# Patient Record
Sex: Female | Born: 1990 | Race: White | Hispanic: Yes | Marital: Single | Smoking: Never smoker
Health system: Southern US, Community
[De-identification: ages and names within clinical notes are randomized; demographics above are authoritative.]

## PROBLEM LIST (undated history)

## (undated) ENCOUNTER — Emergency Department (HOSPITAL_COMMUNITY): Discharge status: Absent without leave | Payer: Self-pay

## (undated) ENCOUNTER — Emergency Department (HOSPITAL_COMMUNITY): Admission: EM | Payer: Self-pay | Source: Home / Self Care

## (undated) DIAGNOSIS — K8020 Calculus of gallbladder without cholecystitis without obstruction: Secondary | ICD-10-CM

## (undated) HISTORY — PX: CHOLECYSTECTOMY: SHX55

---
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
## 2009-02-19 ENCOUNTER — Emergency Department (HOSPITAL_COMMUNITY): Admission: EM | Admit: 2009-02-19 | Discharge: 2009-02-20 | Payer: Self-pay | Admitting: Emergency Medicine

## 2009-12-11 ENCOUNTER — Emergency Department (HOSPITAL_COMMUNITY): Admission: EM | Admit: 2009-12-11 | Discharge: 2009-12-11 | Payer: Self-pay | Admitting: Emergency Medicine

## 2010-05-02 LAB — URINE MICROSCOPIC-ADD ON

## 2010-05-02 LAB — URINALYSIS, ROUTINE W REFLEX MICROSCOPIC
Nitrite: NEGATIVE
Specific Gravity, Urine: 1.033 — ABNORMAL HIGH (ref 1.005–1.030)
Urobilinogen, UA: 0.2 mg/dL (ref 0.0–1.0)
pH: 5.5 (ref 5.0–8.0)

## 2010-05-02 LAB — WET PREP, GENITAL
Clue Cells Wet Prep HPF POC: NONE SEEN
Trich, Wet Prep: NONE SEEN

## 2010-05-02 LAB — URINE CULTURE

## 2010-05-02 LAB — POCT PREGNANCY, URINE: Preg Test, Ur: NEGATIVE

## 2010-05-02 LAB — GC/CHLAMYDIA PROBE AMP, GENITAL: GC Probe Amp, Genital: NEGATIVE

## 2010-05-06 LAB — URINALYSIS, ROUTINE W REFLEX MICROSCOPIC
Glucose, UA: NEGATIVE mg/dL
Ketones, ur: 15 mg/dL — AB
Specific Gravity, Urine: 1.03 (ref 1.005–1.030)
pH: 6 (ref 5.0–8.0)

## 2010-05-06 LAB — URINE MICROSCOPIC-ADD ON

## 2010-05-06 LAB — POCT PREGNANCY, URINE: Preg Test, Ur: NEGATIVE

## 2010-05-24 LAB — URINALYSIS, ROUTINE W REFLEX MICROSCOPIC
Bilirubin Urine: NEGATIVE
Protein, ur: NEGATIVE mg/dL
Specific Gravity, Urine: 1.017 (ref 1.005–1.030)
Urobilinogen, UA: 0.2 mg/dL (ref 0.0–1.0)

## 2010-05-24 LAB — COMPREHENSIVE METABOLIC PANEL
ALT: 30 U/L (ref 0–35)
BUN: 12 mg/dL (ref 6–23)
CO2: 23 mEq/L (ref 19–32)
Calcium: 9 mg/dL (ref 8.4–10.5)
Creatinine, Ser: 0.5 mg/dL (ref 0.4–1.2)
GFR calc Af Amer: >60 mL/min (ref >60)
GFR calc non Af Amer: >60 mL/min (ref >60)
Glucose, Bld: 89 mg/dL (ref 70–99)
Sodium: 137 mEq/L (ref 135–145)

## 2010-05-24 LAB — WET PREP, GENITAL
Clue Cells Wet Prep HPF POC: NONE SEEN
Trich, Wet Prep: NONE SEEN

## 2010-05-24 LAB — CBC
Hemoglobin: 13.7 g/dL (ref 12.0–15.0)
RBC: 4.56 MIL/uL (ref 3.87–5.11)
WBC: 6.2 10*3/uL (ref 4.0–10.5)

## 2010-05-24 LAB — DIFFERENTIAL
Basophils Absolute: 0 10*3/uL (ref 0.0–0.1)
Eosinophils Relative: 2 % (ref 0–5)
Lymphocytes Relative: 35 % (ref 12–46)

## 2010-05-24 LAB — POCT PREGNANCY, URINE: Preg Test, Ur: NEGATIVE

## 2010-05-27 LAB — CBC
HCT: 40.4 % (ref 36.0–46.0)
Hemoglobin: 14.2 g/dL (ref 12.0–15.0)
MCV: 87.6 fL (ref 78.0–100.0)
Platelets: 162 10*3/uL (ref 150–400)
RBC: 4.61 MIL/uL (ref 3.87–5.11)
WBC: 5.4 10*3/uL (ref 4.0–10.5)

## 2010-05-27 LAB — COMPREHENSIVE METABOLIC PANEL
BUN: 15 mg/dL (ref 6–23)
CO2: 25 mEq/L (ref 19–32)
Chloride: 105 mEq/L (ref 96–112)
Creatinine, Ser: 0.49 mg/dL (ref 0.4–1.2)
GFR calc non Af Amer: >60 mL/min (ref >60)
Glucose, Bld: 92 mg/dL (ref 70–99)
Total Bilirubin: 0.4 mg/dL (ref 0.3–1.2)

## 2010-05-27 LAB — WET PREP, GENITAL
Clue Cells Wet Prep HPF POC: NONE SEEN
Trich, Wet Prep: NONE SEEN
Yeast Wet Prep HPF POC: NONE SEEN

## 2010-05-27 LAB — URINALYSIS, ROUTINE W REFLEX MICROSCOPIC
Nitrite: NEGATIVE
Protein, ur: NEGATIVE mg/dL
Specific Gravity, Urine: 1.025 (ref 1.005–1.030)
Urobilinogen, UA: 0.2 mg/dL (ref 0.0–1.0)

## 2010-05-27 LAB — GC/CHLAMYDIA PROBE AMP, GENITAL: GC Probe Amp, Genital: NEGATIVE

## 2010-05-27 LAB — URINE MICROSCOPIC-ADD ON

## 2010-11-16 LAB — URINALYSIS, ROUTINE W REFLEX MICROSCOPIC
Bilirubin Urine: NEGATIVE
Glucose, UA: NEGATIVE
Ketones, ur: NEGATIVE
Specific Gravity, Urine: 1.02
pH: 6

## 2010-11-16 LAB — WET PREP, GENITAL: Yeast Wet Prep HPF POC: NONE SEEN

## 2010-11-16 LAB — GC/CHLAMYDIA PROBE AMP, GENITAL
Chlamydia, DNA Probe: NEGATIVE
GC Probe Amp, Genital: NEGATIVE

## 2010-11-27 LAB — HEPATIC FUNCTION PANEL
Albumin: 2 — ABNORMAL LOW
Alkaline Phosphatase: 149 — ABNORMAL HIGH
Indirect Bilirubin: 0.6
Total Protein: 5.3 — ABNORMAL LOW

## 2010-11-27 LAB — CBC
HCT: 37.4
Hemoglobin: 13.1
MCHC: 35
MCHC: 35
MCV: 86.5
Platelets: 127 — ABNORMAL LOW
Platelets: 141 — ABNORMAL LOW
RBC: 3.32 — ABNORMAL LOW
RDW: 13.8
RDW: 14.2

## 2011-04-13 ENCOUNTER — Emergency Department (HOSPITAL_COMMUNITY)
Admission: EM | Admit: 2011-04-13 | Discharge: 2011-04-13 | Discharge status: Against Medical Advice | Payer: Self-pay | Attending: Emergency Medicine | Admitting: Emergency Medicine

## 2011-04-13 ENCOUNTER — Encounter (HOSPITAL_COMMUNITY): Payer: Self-pay

## 2011-04-13 DIAGNOSIS — M25519 Pain in unspecified shoulder: Secondary | ICD-10-CM | POA: Insufficient documentation

## 2011-04-13 DIAGNOSIS — M542 Cervicalgia: Secondary | ICD-10-CM | POA: Insufficient documentation

## 2011-04-13 DIAGNOSIS — M79609 Pain in unspecified limb: Secondary | ICD-10-CM | POA: Insufficient documentation

## 2011-04-13 HISTORY — DX: Calculus of gallbladder without cholecystitis without obstruction: K80.20
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
## 2015-05-11 ENCOUNTER — Emergency Department (INDEPENDENT_AMBULATORY_CARE_PROVIDER_SITE_OTHER): Payer: Worker's Compensation

## 2015-05-11 ENCOUNTER — Emergency Department (INDEPENDENT_AMBULATORY_CARE_PROVIDER_SITE_OTHER)
Admission: EM | Admit: 2015-05-11 | Discharge: 2015-05-11 | Disposition: A | Discharge status: Home / Self Care | Payer: Worker's Compensation | Source: Home / Self Care | Attending: Family Medicine | Admitting: Family Medicine

## 2015-05-11 ENCOUNTER — Encounter (HOSPITAL_COMMUNITY): Payer: Self-pay | Admitting: Emergency Medicine

## 2015-05-11 DIAGNOSIS — S3992XA Unspecified injury of lower back, initial encounter: Secondary | ICD-10-CM

## 2015-05-11 DIAGNOSIS — N926 Irregular menstruation, unspecified: Secondary | ICD-10-CM

## 2015-05-11 LAB — POCT PREGNANCY, URINE: Preg Test, Ur: NEGATIVE

## 2015-05-11 MED ORDER — KETOROLAC TROMETHAMINE 60 MG/2ML IM SOLN
INTRAMUSCULAR | Status: AC
  Filled 2015-05-11: qty 2

## 2015-05-11 MED ORDER — NAPROXEN 500 MG PO TABS: 500.0000 mg | ORAL_TABLET | Freq: Two times a day (BID) | ORAL | Status: DC

## 2015-05-11 MED ORDER — KETOROLAC TROMETHAMINE 60 MG/2ML IM SOLN
60.0000 mg | Freq: Once | INTRAMUSCULAR | Status: AC
  Administered 2015-05-11: 60 mg via INTRAMUSCULAR

## 2015-05-11 MED ORDER — CYCLOBENZAPRINE HCL 10 MG PO TABS: 10.0000 mg | ORAL_TABLET | Freq: Every day | ORAL | Status: DC

## 2015-05-11 NOTE — Discharge Instructions (Signed)
Fue un Research officer, trade unionplacer verle hoy.    La prueba de embarazo salio' negativa.   Las placas del cuello, de la espalda y del codo izquierdo no demuestran ningun tipo de Surveyor, mineralsfractura de Shaw[place]os huesos.  Recibio' una inyeccion de una medicina anti-inflamatoria que se llama TORADOL 60mg .   Le estoy recetando otro anti-inflamatorio, NAPROXEN 500mg , para comenzar a tomar a Producer, television/film/videopartir de manana.  Debe tomar 1 tableta cada 12 horas, con algo de comer.   Relajante de musculos FLEXERIL 10mg  , tome una tableta por boca, antes de acostarse en la noche.  Le puede causar sueno.   Nota para el trabajo para [place].  Recomiendo que vaya manana a las 800am al OCCUPATIONAL MEDICINE en [place] (al lado del hospital).

## 2015-05-11 NOTE — ED Notes (Signed)
Patient c/o fall this morning aroung 5 am at work. Patient reports since then she has had pain. Patient is ambulating slowly. Patient speaking in spanish and son is translating so history is limited.

## 2015-05-11 NOTE — ED Provider Notes (Addendum)
CSN: [id number]     Arrival date & time 05/11/15  1830 History   First MD Initiated Contact with Patient 05/11/15 2041     Chief Complaint  Patient presents with  . Back Injury  . Fall   (Consider location/radiation/quality/duration/timing/severity/associated sxs/prior Treatment) Patient is a 25 y.o. female presenting with fall. The history is provided by the patient. No language interpreter was used.  Fall  Visit conducted in [place].    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 Slipped on floor and fell backward directly onto her back. She hit the back of her head to the floor, as well as her lower back and L elbow.  Has taken Tylenol for pain today.  Has had difficulty ambulating due to the pain today.   PMHx; no medication allergies.  No history of kidney or liver disease.   Social Hx: works in Marine scientistfactory, involves physical labor.   ROS: LMP first week February; she notes she is late for her menses. No loss of control of bowel or bladder.  No saddle anesthesia.   Past Medical History  Diagnosis Date  . Gallstones    Past Surgical History  Procedure Laterality Date  . Cholecystectomy     No family history on file. Social History  Substance Use Topics  . Smoking status: Never Smoker   . Smokeless tobacco: None  . Alcohol Use: None   OB History    No data available     Review of Systems  Constitutional: Negative for fever, chills, diaphoresis and fatigue.    Allergies  Review of patient's allergies indicates no known allergies.  Home Medications   Prior to Admission medications   Medication Sig Start Date End Date Taking? Authorizing Provider  HYDROcodone-acetaminophen (NORCO) 10-325 MG per tablet Take 1 tablet by mouth every 6 (six) hours as needed.    Historical Provider, MD   Meds Ordered and Administered this Visit  Medications - No data to display  BP 114/79 mmHg  Pulse  90  Temp(Src) 97.9 F (36.6 C) (Oral)  Resp 16  SpO2 100% No data found.   Physical Exam  Constitutional: She appears well-developed and well-nourished. No distress.  Alert, generally well appearing. Moving slowly due to apparent pain. No acute distress.   HENT:  Head: Normocephalic.  Right Ear: External ear normal.  Left Ear: External ear normal.  Mouth/Throat: Oropharyngeal exudate present.  Eyes: Pupils are equal, round, and reactive to light.  Neck: Neck supple.  Limited active ROM with rotation bilaterally, as well as with flexion/extension.  No point tenderness over vertebral processes in C-T-or LS-spine.   Musculoskeletal:  Limited active ROM neck with flexion/extension and rotation L and R.  No point tenderness over vertebral processes in spine (c to lumbosacral spine).   There is tenderness along paraspinous mm in mid-thoracic and lumbar spine.   Able to flex hips full strength symmetrically.   Sensation in feet full and symmetric. Palpable dp pulses bilaterally, no edema. Full strength ankles dorsi/plantarflexion bilaterally.  Knees full flexion and extension bilaterally.   Tenderness over L olecranon; handgrip full and symmetric bilaterally. Sensation in hands full and symmetric. Full ROM wrists, no tenderness bilaterally.   Lymphadenopathy:    She has no cervical adenopathy.  Skin: She is not diaphoretic.    ED Course  Procedures (including critical care time)  Labs Review Labs Reviewed - No data to display  Imaging Review No results found.   Visual Acuity Review  Right Eye Distance:   Left Eye Distance:   Bilateral Distance:    Right Eye Near:   Left Eye Near:    Bilateral Near:         MDM   1. Back injury, initial encounter   2. Missed period    Urine pregnancy test before x-rays.   Patient with L elbow and neck/low back pain after fall at work.  X rays L elbow, cervical spine and lumbar spine reviewed by me, no evidence of fracture or  dislocation.  Toradol IM at [facility] tonight; Naproxen  twice daily, and flexeril  at bedtime. Ice/heat to back, neck, elbow.    Note for work for tomorrow.   Occupational medicine in the morning.     Barbaraann Barthel, MD 05/11/15 0981  Barbaraann Barthel, MD 05/11/15 1914  Barbaraann Barthel, MD 05/11/15 [phone]

## 2015-05-19 ENCOUNTER — Other Ambulatory Visit: Payer: Self-pay | Admitting: Occupational Medicine

## 2015-05-19 ENCOUNTER — Ambulatory Visit: Payer: Self-pay

## 2015-05-19 DIAGNOSIS — M549 Dorsalgia, unspecified: Secondary | ICD-10-CM

## 2015-06-15 ENCOUNTER — Emergency Department (HOSPITAL_COMMUNITY)
Admission: EM | Admit: 2015-06-15 | Discharge: 2015-06-15 | Disposition: A | Discharge status: Home / Self Care | Payer: Self-pay | Attending: Emergency Medicine | Admitting: Emergency Medicine

## 2015-06-15 ENCOUNTER — Encounter (HOSPITAL_COMMUNITY): Payer: Self-pay

## 2015-06-15 DIAGNOSIS — Z791 Long term (current) use of non-steroidal anti-inflammatories (NSAID): Secondary | ICD-10-CM | POA: Insufficient documentation

## 2015-06-15 DIAGNOSIS — Z79899 Other long term (current) drug therapy: Secondary | ICD-10-CM | POA: Insufficient documentation

## 2015-06-15 DIAGNOSIS — Z8719 Personal history of other diseases of the digestive system: Secondary | ICD-10-CM | POA: Insufficient documentation

## 2015-06-15 DIAGNOSIS — J069 Acute upper respiratory infection, unspecified: Secondary | ICD-10-CM | POA: Insufficient documentation

## 2015-06-15 DIAGNOSIS — H109 Unspecified conjunctivitis: Secondary | ICD-10-CM | POA: Insufficient documentation

## 2015-06-15 MED ORDER — DEXAMETHASONE SODIUM PHOSPHATE 10 MG/ML IJ SOLN
10.0000 mg | Freq: Once | INTRAMUSCULAR | Status: AC
  Administered 2015-06-15: 10 mg via INTRAMUSCULAR
  Filled 2015-06-15: qty 1

## 2015-06-15 MED ORDER — LORATADINE-PSEUDOEPHEDRINE ER 5-120 MG PO TB12: 1.0000 | ORAL_TABLET | Freq: Two times a day (BID) | ORAL | Status: DC

## 2015-06-15 MED ORDER — ERYTHROMYCIN 5 MG/GM OP OINT
1.0000 "application " | TOPICAL_OINTMENT | Freq: Once | OPHTHALMIC | Status: AC
  Administered 2015-06-15: 1 via OPHTHALMIC
  Filled 2015-06-15: qty 3.5

## 2015-06-15 MED ORDER — BENZONATATE 100 MG PO CAPS: 100.0000 mg | ORAL_CAPSULE | Freq: Three times a day (TID) | ORAL | Status: DC

## 2015-06-15 MED ORDER — KETOROLAC TROMETHAMINE 60 MG/2ML IM SOLN
60.0000 mg | Freq: Once | INTRAMUSCULAR | Status: AC
  Administered 2015-06-15: 60 mg via INTRAMUSCULAR
  Filled 2015-06-15: qty 2

## 2015-06-15 NOTE — ED Provider Notes (Signed)
CSN: [id number]     Arrival date & time 06/15/15  [id number] History   First MD Initiated Contact with Patient 06/15/15 [phone]     Chief Complaint  Patient presents with  . URI  . Conjunctivitis     (Consider location/radiation/quality/duration/timing/severity/associated sxs/prior Treatment) Patient is a 25 y.o. female presenting with URI.  URI Presenting symptoms: congestion, cough, fever, rhinorrhea and sore throat   Severity:  Mild Onset quality:  Gradual Duration:  3 days Timing:  Constant Progression:  Worsening Chronicity:  New Relieved by:  None tried Worsened by:  Nothing tried Ineffective treatments:  None tried Associated symptoms: sinus pain   Associated symptoms: no arthralgias   Risk factors: no recent illness and no recent travel     Past Medical History  Diagnosis Date  . Gallstones    Past Surgical History  Procedure Laterality Date  . Cholecystectomy     History reviewed. No pertinent family history. Social History  Substance Use Topics  . Smoking status: Never Smoker   . Smokeless tobacco: None  . Alcohol Use: None   OB History    No data available     Review of Systems  Constitutional: Positive for fever.  HENT: Positive for congestion, rhinorrhea and sore throat.   Eyes: Negative for pain.  Respiratory: Positive for cough.   Musculoskeletal: Negative for arthralgias.  All other systems reviewed and are negative.     Allergies  Review of patient's allergies indicates no known allergies.  Home Medications   Prior to Admission medications   Medication Sig Start Date End Date Taking? Authorizing Provider  benzonatate (TESSALON) 100 MG capsule Take 1 capsule (100 mg total) by mouth every 8 (eight) hours. 06/15/15   Marily MemosJason Jameriah Trotti, MD  cyclobenzaprine (FLEXERIL) 10 MG tablet Take 1 tablet (10 mg total) by mouth at bedtime. 05/11/15   Barbaraann BarthelJames O Breen, MD  loratadine-pseudoephedrine (CLARITIN-D 12 HOUR) 5-120 MG tablet Take 1 tablet by mouth 2 (two)  times daily. 06/15/15   Marily MemosJason Jillayne Witte, MD  naproxen (NAPROSYN) 500 MG tablet Take 1 tablet (500 mg total) by mouth 2 (two) times daily with a meal. 05/11/15   Barbaraann BarthelJames O Breen, MD   BP 130/75 mmHg  Pulse 100  Temp(Src) 98.7 F (37.1 C) (Oral)  Resp 18  Ht 5\' 1"  (1.549 m)  Wt 138 lb (62.596 kg)  BMI 26.09 kg/m2  SpO2 98%  LMP 05/16/2015 Physical Exam  Constitutional: She is oriented to person, place, and time. She appears well-developed and well-nourished.  HENT:  Head: Normocephalic and atraumatic.  Nose: Mucosal edema present.  Mouth/Throat: Posterior oropharyngeal erythema present.  Neck: Normal range of motion.  Cardiovascular: Normal rate and regular rhythm.   Pulmonary/Chest: Effort normal. No stridor. No respiratory distress. She has no wheezes.  Abdominal: Soft. She exhibits no distension. There is no tenderness.  Musculoskeletal: Normal range of motion. She exhibits no edema or tenderness.  Neurological: She is alert and oriented to person, place, and time. No cranial nerve deficit. Coordination normal.  Skin: Skin is warm and dry.  Nursing note and vitals reviewed.   ED Course  Procedures (including critical care time) Labs Review Labs Reviewed - No data to display  Imaging Review No results found. I have personally reviewed and evaluated these images and lab results as part of my medical decision-making.   EKG Interpretation None      MDM   Final diagnoses:  URI (upper respiratory infection)  Conjunctivitis of left eye   Glenford PeersUri  with likely viral conjunctivitis. No asymmetric lung sounds to suggest pneumonia. S/s c/w likely influenza.  New Prescriptions: New Prescriptions   BENZONATATE (TESSALON) 100 MG CAPSULE    Take 1 capsule (100 mg total) by mouth every 8 (eight) hours.   LORATADINE-PSEUDOEPHEDRINE (CLARITIN-D 12 HOUR) 5-120 MG TABLET    Take 1 tablet by mouth 2 (two) times daily.     I have personally and contemperaneously reviewed labs and imaging and  used in my decision making as above.   A medical screening exam was performed and I feel the patient has had an appropriate workup for their chief complaint at this time and likelihood of emergent condition existing is low. Their vital signs are stable. They have been counseled on decision, discharge, follow up and which symptoms necessitate immediate return to the emergency department.  They verbally stated understanding and agreement with plan and discharged in stable condition.      Marily Memos, MD 06/15/15 [phone]

## 2015-06-15 NOTE — ED Notes (Signed)
Pt reports redness and itching to left eye along with cough x15 days.  Pt also reports nosebleeds.

## 2015-06-19 ENCOUNTER — Emergency Department (HOSPITAL_COMMUNITY): Payer: Self-pay

## 2015-06-19 ENCOUNTER — Encounter (HOSPITAL_COMMUNITY): Payer: Self-pay | Admitting: Emergency Medicine

## 2015-06-19 DIAGNOSIS — J069 Acute upper respiratory infection, unspecified: Secondary | ICD-10-CM | POA: Insufficient documentation

## 2015-06-19 DIAGNOSIS — J302 Other seasonal allergic rhinitis: Secondary | ICD-10-CM | POA: Insufficient documentation

## 2015-06-19 DIAGNOSIS — Z79899 Other long term (current) drug therapy: Secondary | ICD-10-CM | POA: Insufficient documentation

## 2015-06-19 DIAGNOSIS — Z791 Long term (current) use of non-steroidal anti-inflammatories (NSAID): Secondary | ICD-10-CM | POA: Insufficient documentation

## 2015-06-19 DIAGNOSIS — J209 Acute bronchitis, unspecified: Secondary | ICD-10-CM | POA: Insufficient documentation

## 2015-06-19 DIAGNOSIS — R079 Chest pain, unspecified: Secondary | ICD-10-CM | POA: Insufficient documentation

## 2015-06-19 LAB — CBC
HEMATOCRIT: 40.7 % (ref 36.0–46.0)
Hemoglobin: 14.1 g/dL (ref 12.0–15.0)
MCH: 29 pg (ref 26.0–34.0)
MCHC: 34.6 g/dL (ref 30.0–36.0)
MCV: 83.7 fL (ref 78.0–100.0)
PLATELETS: 219 10*3/uL (ref 150–400)
RBC: 4.86 MIL/uL (ref 3.87–5.11)
RDW: 12.5 % (ref 11.5–15.5)
WBC: 8 10*3/uL (ref 4.0–10.5)

## 2015-06-19 NOTE — ED Notes (Signed)
Pt states she has has coughing and watery eyes that she thought was related to all the pollen but for the last couple of days she has been nausea and coughed up blood twice. Pt states sometimes when coughing she has felt short of breath and having central chest pain.

## 2015-06-20 ENCOUNTER — Emergency Department (HOSPITAL_COMMUNITY)
Admission: EM | Admit: 2015-06-20 | Discharge: 2015-06-20 | Disposition: A | Discharge status: Home / Self Care | Payer: Self-pay | Attending: Emergency Medicine | Admitting: Emergency Medicine

## 2015-06-20 DIAGNOSIS — J069 Acute upper respiratory infection, unspecified: Secondary | ICD-10-CM

## 2015-06-20 DIAGNOSIS — J302 Other seasonal allergic rhinitis: Secondary | ICD-10-CM

## 2015-06-20 DIAGNOSIS — J40 Bronchitis, not specified as acute or chronic: Secondary | ICD-10-CM

## 2015-06-20 LAB — I-STAT TROPONIN, ED: Troponin i, poc: 0 ng/mL (ref 0.00–0.08)

## 2015-06-20 LAB — BASIC METABOLIC PANEL
ANION GAP: 10 (ref 5–15)
BUN: 17 mg/dL (ref 6–20)
CALCIUM: 9.3 mg/dL (ref 8.9–10.3)
CO2: 25 mmol/L (ref 22–32)
Chloride: 104 mmol/L (ref 101–111)
Creatinine, Ser: 0.78 mg/dL (ref 0.44–1.00)
GFR calc Af Amer: >60 mL/min (ref >60)
GFR calc non Af Amer: >60 mL/min (ref >60)
GLUCOSE: 101 mg/dL — AB (ref 65–99)
Potassium: 3.5 mmol/L (ref 3.5–5.1)
Sodium: 139 mmol/L (ref 135–145)

## 2015-06-20 MED ORDER — IPRATROPIUM BROMIDE 0.02 % IN SOLN
0.5000 mg | Freq: Once | RESPIRATORY_TRACT | Status: AC
  Administered 2015-06-20: 0.5 mg via RESPIRATORY_TRACT
  Filled 2015-06-20: qty 2.5

## 2015-06-20 MED ORDER — PREDNISONE 20 MG PO TABS
60.0000 mg | ORAL_TABLET | Freq: Once | ORAL | Status: AC
  Administered 2015-06-20: 60 mg via ORAL
  Filled 2015-06-20: qty 3

## 2015-06-20 MED ORDER — ALBUTEROL SULFATE (2.5 MG/3ML) 0.083% IN NEBU: 2.5000 mg | INHALATION_SOLUTION | Freq: Four times a day (QID) | RESPIRATORY_TRACT | Status: DC | PRN

## 2015-06-20 MED ORDER — CETIRIZINE HCL 10 MG PO TABS: 10.0000 mg | ORAL_TABLET | Freq: Every day | ORAL | Status: DC

## 2015-06-20 MED ORDER — AZITHROMYCIN 250 MG PO TABS: 250.0000 mg | ORAL_TABLET | Freq: Every day | ORAL | Status: DC

## 2015-06-20 MED ORDER — PREDNISONE 20 MG PO TABS: 40.0000 mg | ORAL_TABLET | Freq: Every day | ORAL | Status: DC

## 2015-06-20 MED ORDER — ALBUTEROL SULFATE (2.5 MG/3ML) 0.083% IN NEBU
5.0000 mg | INHALATION_SOLUTION | Freq: Once | RESPIRATORY_TRACT | Status: AC
Start: 2015-06-20 — End: 2015-06-20
  Administered 2015-06-20: 5 mg via RESPIRATORY_TRACT
  Filled 2015-06-20: qty 6

## 2015-06-20 MED ORDER — ALBUTEROL SULFATE HFA 108 (90 BASE) MCG/ACT IN AERS
2.0000 | INHALATION_SPRAY | Freq: Once | RESPIRATORY_TRACT | Status: AC
  Administered 2015-06-20: 2 via RESPIRATORY_TRACT
  Filled 2015-06-20: qty 6.7

## 2015-06-20 MED ORDER — BENZONATATE 100 MG PO CAPS
200.0000 mg | ORAL_CAPSULE | Freq: Once | ORAL | Status: AC
  Administered 2015-06-20: 200 mg via ORAL
  Filled 2015-06-20 (×2): qty 2

## 2015-06-20 MED ORDER — ALBUTEROL SULFATE (2.5 MG/3ML) 0.083% IN NEBU
5.0000 mg | INHALATION_SOLUTION | Freq: Once | RESPIRATORY_TRACT | Status: AC
  Administered 2015-06-20: 5 mg via RESPIRATORY_TRACT
  Filled 2015-06-20: qty 6

## 2015-06-20 MED ORDER — GUAIFENESIN-CODEINE 100-10 MG/5ML PO SOLN: 5.0000 mL | Freq: Three times a day (TID) | ORAL | Status: DC | PRN

## 2015-06-20 MED ORDER — ALBUTEROL SULFATE HFA 108 (90 BASE) MCG/ACT IN AERS: 1.0000 | INHALATION_SPRAY | Freq: Four times a day (QID) | RESPIRATORY_TRACT | Status: DC | PRN

## 2015-06-20 MED ORDER — BENZONATATE 100 MG PO CAPS: 200.0000 mg | ORAL_CAPSULE | Freq: Two times a day (BID) | ORAL | Status: DC | PRN

## 2015-06-20 MED ORDER — BENZONATATE 100 MG PO CAPS
200.0000 mg | ORAL_CAPSULE | Freq: Once | ORAL | Status: DC
  Filled 2015-06-20: qty 2

## 2015-06-20 MED ORDER — PREDNISONE 20 MG PO TABS
40.0000 mg | ORAL_TABLET | Freq: Once | ORAL | Status: AC
  Administered 2015-06-20: 40 mg via ORAL
  Filled 2015-06-20: qty 2

## 2015-06-20 MED ORDER — IPRATROPIUM-ALBUTEROL 0.5-2.5 (3) MG/3ML IN SOLN
3.0000 mL | Freq: Once | RESPIRATORY_TRACT | Status: AC
  Administered 2015-06-20: 3 mL via RESPIRATORY_TRACT
  Filled 2015-06-20: qty 3

## 2015-06-20 NOTE — Discharge Instructions (Signed)
Acute Bronchitis Bronchitis is inflammation of the airways that extend from the windpipe into the lungs (bronchi). The inflammation often causes mucus to develop. This leads to a cough, which is the most common symptom of bronchitis.  In acute bronchitis, the condition usually develops suddenly and goes away over time, usually in a couple weeks. Smoking, allergies, and asthma can make bronchitis worse. Repeated episodes of bronchitis may cause further lung problems.  CAUSES Acute bronchitis is most often caused by the same virus that causes a cold. The virus can spread from person to person (contagious) through coughing, sneezing, and touching contaminated objects. SIGNS AND SYMPTOMS   Cough.   Fever.   Coughing up mucus.   Body aches.   Chest congestion.   Chills.   Shortness of breath.   Sore throat.  DIAGNOSIS  Acute bronchitis is usually diagnosed through a physical exam. Your health care provider will also ask you questions about your medical history. Tests, such as chest X-rays, are sometimes done to rule out other conditions.  TREATMENT  Acute bronchitis usually goes away in a couple weeks. Oftentimes, no medical treatment is necessary. Medicines are sometimes given for relief of fever or cough. Antibiotic medicines are usually not needed but may be prescribed in certain situations. In some cases, an inhaler may be recommended to help reduce shortness of breath and control the cough. A cool mist vaporizer may also be used to help thin bronchial secretions and make it easier to clear the chest.  HOME CARE INSTRUCTIONS  Get plenty of rest.   Drink enough fluids to keep your urine clear or pale yellow (unless you have a medical condition that requires fluid restriction). Increasing fluids may help thin your respiratory secretions (sputum) and reduce chest congestion, and it will prevent dehydration.   Take medicines only as directed by your health care provider.  If  you were prescribed an antibiotic medicine, finish it all even if you start to feel better.  Avoid smoking and secondhand smoke. Exposure to cigarette smoke or irritating chemicals will make bronchitis worse. If you are a smoker, consider using nicotine gum or skin patches to help control withdrawal symptoms. Quitting smoking will help your lungs heal faster.   Reduce the chances of another bout of acute bronchitis by washing your hands frequently, avoiding people with cold symptoms, and trying not to touch your hands to your mouth, nose, or eyes.   Keep all follow-up visits as directed by your health care provider.  SEEK MEDICAL CARE IF: Your symptoms do not improve after 1 week of treatment.  SEEK IMMEDIATE MEDICAL CARE IF:  You develop an increased fever or chills.   You have chest pain.   You have severe shortness of breath.  You have bloody sputum.   You develop dehydration.  You faint or repeatedly feel like you are going to pass out.  You develop repeated vomiting.  You develop a severe headache. MAKE SURE YOU:   Understand these instructions.  Will watch your condition.  Will get help right away if you are not doing well or get worse.   This information is not intended to replace advice given to you by your health care provider. Make sure you discuss any questions you have with your health care provider.   Document Released: 03/14/2004 Document Revised: 02/25/2014 Document Reviewed: 07/28/2012 Elsevier Interactive Patient Education 2016 [place], broncoespasmo agudo (Asthma, Acute Bronchospasm) El broncoespasmo agudo causado por el asma tambin se conoce como crisis  de asma. Broncoespasmo significa que las vas respiratorias se han estrechado. La causa del estrechamiento es la inflamacin y la constriccin de los msculos de las vas respiratorias (bronquios) que se encuentran en los pulmones. Esto puede dificultar la respiracin o provocarle  sibilancias y tos. [place] desencadenantes posibles son:  La caspa que eliminan los animales de la piel, el pelo o las plumas de [place].  Los caros que se encuentran en el polvo de la casa.  [place].  El polen de los rboles o el csped.  [place]o.  El humo del cigarrillo o del tabaco  Sustancias contaminantes como el polvo, limpiadores hogareos, aerosoles (como los [place] para el cabello), vapores de pintura, sustancias qumicas fuertes u olores intensos.  El aire fro o cambios climticos. El aire fro puede causar inflamacin. El viento aumenta la cantidad de moho y polen del aire.  Emociones fuertes, Engineer, production o rer intensamente.  Estrs.  Ciertos medicamentos como la aspirina o betabloqueantes.  Los sulfitos que se encuentran en las comidas y bebidas como frutas secas y el vino.  Enfermedades infecciosas o inflamatorias, como la gripe, el resfro o la inflamacin de las membranas nasales (rinitis).  El reflujo gastroesofgico (ERGE). El reflujo gastroesofgico es una afeccin en la que los cidos estomacales vuelven al esfago.  Los ejercicios o actividades extenuantes. [place].  Tos intensa, especialmente por la noche.  Opresin en el pecho.  Falta de aire. DIAGNSTICO  El mdico le har una historia clnica y le har un examen fsico. [place] Comment indicarn radiografas o anlisis de sangre para buscar otras causas de los sntomas u otras enfermedades que puedan desencadenar una crisis de asma.  TRATAMIENTO  El tratamiento est dirigido a reducir la inflamacin y [place] vas respiratorias en los pulmones. La mayor parte de las crisis asmticas se tratan con medicamentos por va inhalatoria. Entre ellos se incluyen los medicamentos de alivio rpido o medicamentos de rescate (como los broncodilatadores) y los medicamentos de control (como los corticoides inhalados). Estos medicamentos se administran a travs de Educational psychologist o de un  nebulizador. Los corticoides sistmicos por va oral o por va intravenosa tambin se administran para reducir la inflamacin cuando un ataque es moderado o grave. Los antibiticos se indican solo si hay infeccin bacteriana.  INSTRUCCIONES PARA EL CUIDADO EN EL HOGAR   Reposo.  Beba lquido en abundancia. Esto ayuda a diluir la mucosidad y a Transport planner. Solo consuma productos con cafena moderadamente y no consuma alcohol hasta que se haya recuperado de la enfermedad.  No fume. Evite la exposicin al humo de otros fumadores.  Usted tiene un rol fundamental en mantener su buena salud. Evite la exposicin a lo que [facility] respiratorios.  Mantenga los medicamentos actualizados y al alcance. Siga cuidadosamente el plan de tratamiento del mdico.  Utilice los medicamentos tal como se le indic.  Cuando haya mucho polen o polucin, mantenga las ventanas cerradas y use el aire acondicionado o vaya a lugares con aire acondicionado.  El asma requiere atencin [place]. Concurra a los controles segn las indicaciones. Si tiene Nutritional therapist de ms de 24 semanas y le han recetado medicamentos nuevos, comntelo con su obstetra y cul es su evolucin. Concurra a las consultas de control con su mdico segn las indicaciones.  Despus de recuperarse de la crisis de asma, haga una cita con el mdico para conocer cmo puede reducir la probabilidad de futuros ataques. Si no cuenta con un  mdico para que controle su asma, haga una cita con un mdico de atencin primaria para hablar de esta enfermedad. [place] DE INMEDIATO SI:   Empeora.  Tiene dificultad para respirar. Si la dificultad es intensa comunquese con el servicio de Multimedia programmer de su localidad (911 en los [place]).  Siente dolor o Adult nurse.  Tiene vmitos.  No puede retener los lquidos.  Elimina una expectoracin verde, amarilla, amarronada o sanguinolenta.  Tiene  fiebre y los sntomas empeoran repentinamente.  Presenta dificultad para tragar. ASEGRESE DE QUE:   Comprende estas instrucciones.  Controlar su afeccin.  Recibir ayuda de inmediato si no mejora o si empeora.   Esta informacin no tiene Marine scientist el consejo del mdico. Asegrese de hacerle al mdico cualquier pregunta que tenga.   Document Released: 05/23/2008 Document Revised: 02/09/2013 Elsevier Interactive Patient Education 2016 [place]  (Viral Infections)  Un virus es un tipo de germen. Puede causar:   Dolor de garganta leve.  Dolores musculares.  Dolor de [place].  Secrecin nasal.  Erupciones.  Lagrimeo.  Cansancio.  Tos.  Prdida del apetito.  Ganas de vomitar (nuseas).  Vmitos.  Materia fecal lquida (diarrea). CUIDADOS EN EL HOGAR   Tome la medicacin slo como le haya indicado el mdico.  Beba gran cantidad de lquido para mantener la orina de tono claro o color amarillo plido. Las bebidas deportivas son Pamala Hurry eleccin.  Descanse lo suficiente y [employer]. Puede tomar sopas y caldos con crackers o arroz. SOLICITE AYUDA DE INMEDIATO SI:   Siente un dolor de cabeza muy intenso.  Le falta el aire.  Tiene dolor en el pecho o en el cuello.  Tiene una erupcin que no tena antes.  No puede detener los vmitos.  Tiene una hemorragia que no se detiene.  No puede retener los lquidos.  Usted o el nio tienen una temperatura oral le sube a ms de 38,9 C (102 F), y no puede bajarla con medicamentos.  Su beb tiene ms de 3 meses y su temperatura rectal es de 102 F (38.9 C) o ms.  Su beb tiene 3 meses o menos y su temperatura rectal es de 100.4 F (38 C) o ms. ASEGRESE DE QUE:   Comprende estas instrucciones.  Controlar la enfermedad.  Solicitar ayuda de inmediato si no mejora o si empeora.   Esta informacin no tiene Marine scientist el consejo del mdico. Asegrese de  hacerle al mdico cualquier pregunta que tenga.   Document Released: 07/09/2010 Document Revised: 04/29/2011 Elsevier Interactive Patient Education 2016 [place] (Allergies) Obie Dredge es una reaccin anormal del sistema de defensa del cuerpo (sistema inmunitario) ante una sustancia. Las Lexicographer a Hotel manager. CULES SON LAS CAUSAS DE LAS ALERGIAS? La reaccin alrgica se produce cuando el sistema inmunitario, por equivocacin, reacciona ante una sustancia normalmente inocua, llamada alrgeno, como si fuera perjudicial. El sistema inmunitario libera anticuerpos para combatir la sustancia. Con el tiempo, los anticuerpos liberan una sustancia qumica llamada histamina en el torrente sanguneo. La liberacin de histamina tiene como fin proteger al cuerpo de la infeccin, pero tambin causa molestias. Cualquiera de estas acciones puede desencadenar una reaccin alrgica:  Comer un alrgeno.  Inhalar un alrgeno.  Tocar un alrgeno. CULES SON LAS CLASES DE ALERGIAS? Hay muchas clases de alergias. [place] ms frecuentes, se incluyen las siguientes:  Water quality scientist. Por lo general, esta clase de alergia se produce por sustancias  que solo estn presentes durante determinadas estaciones, por ejemplo, el moho y el polen.  Alergias a los alimentos.  Alergias a los medicamentos.  Alergias a los insectos.  Alergias a la caspa de [employer]. CULES SON LOS SNTOMAS DE LAS ALERGIAS? Entre los posibles sntomas de la [place], se incluyen los siguientes:  Hinchazn de los labios, la cara, la [place], la boca o la garganta.  Estornudos, tos o sibilancias.  Congestin nasal.  Hormigueo en la boca.  Erupcin cutnea.  Picazn.  Zonas de piel hinchadas, rojas y que producen picazn (ronchas).  Lagrimeo.  Vmitos.  Diarrea.  Mareos.  Sensacin de desvanecimiento.  Desmayos.  Problemas para respirar o tragar.  Opresin en el  pecho.  Latidos cardacos rpidos. CMO SE DIAGNOSTICAN LAS ALERGIAS? Las Medtronic se diagnostican en funcin de los antecedentes mdicos y familiares, y mediante uno o ms de estos elementos:  Pruebas cutneas.  Anlisis de [place].  Un registro de alimentos. Un registro de [employer] Laboratories todos los alimentos y las bebidas que usted consume en un da, y todos los sntomas que experimenta.  Los resultados de una dieta de eliminacin. Una dieta de eliminacin implica eliminar alimentos de la dieta y luego incorporarlos nuevamente, uno a la vez, para averiguar si hay uno en particular que le cause Nurse, mental health. CMO SE TRATAN LAS ALERGIAS? No hay una cura para las alergias, pero las reacciones alrgicas pueden tratarse con medicamentos. Generalmente, las reacciones graves deben tratarse en un hospital. [place]? La mejor manera de prevenir una reaccin alrgica es evitar la sustancia que le causa alergia. Las vacunas y los medicamentos para la alergia tambin pueden ayudar a prevenir las reacciones en [employer]. Las personas con Chief of Staff graves pueden prevenir una reaccin potencialmente mortal llamada anafilaxis con la administracin inmediata de un medicamento despus de la exposicin al alrgeno.   Esta informacin no tiene Marine scientist el consejo del mdico. Asegrese de hacerle al mdico cualquier pregunta que tenga.   Document Released: 02/04/2005 Document Revised: 02/25/2014 Elsevier Interactive Patient Education [employer].

## 2015-06-20 NOTE — ED Provider Notes (Signed)
CSN: [id number]     Arrival date & time 06/19/15  2221 History   First MD Initiated Contact with Patient 06/20/15 0236     Chief Complaint  Patient presents with  . Cough  . Chest Pain     (Consider location/radiation/quality/duration/timing/severity/associated sxs/prior Treatment) HPI Comments: Patient presents with cough and chest pain for the past 2 weeks secondary to URI which was evaluated 4 days ago, patient was diagnosed with URI with conjunctivitis and possible flulike illness, she was discharge with Tessalon Perles, Claritin-D, but she had no improvement.  She return to the ER with worsening cough with occasional hemoptysis, shortness of breath, wheeze.  She continues to have nasal congestion, sore throat, head ache, and chest tightness.   No recently travel, no LE edema, no rash  Patient is a 25 y.o. female presenting with cough, chest pain, shortness of breath, and URI. The history is provided by the patient. The history is limited by a language barrier. A language interpreter was used (family members).  Cough Cough characteristics:  Hacking, nocturnal and productive Sputum characteristics:  Yellow, clear and bloody Severity:  Severe Onset quality:  Gradual Duration:  2 weeks Timing:  Constant Progression:  Worsening Chronicity:  New Smoker: no   Context: exposure to allergens, upper respiratory infection and weather changes   Context: not animal exposure, not fumes, not occupational exposure, not sick contacts, not smoke exposure and not with activity   Relieved by:  Nothing Worsened by:  Deep breathing and environmental changes Ineffective treatments:  Decongestant (antihistamines and tessalon) Associated symptoms: chest pain, rhinorrhea, shortness of breath, sinus congestion, sore throat and wheezing   Associated symptoms: no chills, no diaphoresis, no ear fullness, no ear pain, no eye discharge, no fever, no headaches, no myalgias, no rash and no weight loss   Chest  pain:    Chest pain quality: achy and sore when coughing, tightness at rest.   Severity:  Moderate   Onset quality:  Gradual   Duration:  3 days   Timing:  Intermittent   Progression:  Worsening   Chronicity:  New Shortness of breath:    Severity:  Moderate   Onset quality:  Gradual   Duration:  2 days   Timing:  Constant   Progression:  Worsening Sore throat:    Severity:  Moderate   Onset quality:  Gradual   Duration:  5 days   Timing:  Constant   Progression:  Unchanged Wheezing:    Severity:  Moderate   Onset quality:  Gradual   Duration:  3 days   Timing:  Intermittent   Progression:  Worsening   Chronicity:  New Risk factors: recent infection   Risk factors: no chemical exposure and no recent travel   Chest Pain Associated symptoms: cough and shortness of breath   Associated symptoms: no diaphoresis, no fever and no headache   Shortness of Breath Associated symptoms: chest pain, cough, sore throat and wheezing   Associated symptoms: no diaphoresis, no ear pain, no fever, no headaches and no rash   URI Presenting symptoms: cough, rhinorrhea and sore throat   Presenting symptoms: no ear pain and no fever   Associated symptoms: wheezing   Associated symptoms: no headaches and no myalgias     Past Medical History  Diagnosis Date  . Gallstones    Past Surgical History  Procedure Laterality Date  . Cholecystectomy     No family history on file. Social History  Substance Use Topics  . Smoking  status: Never Smoker   . Smokeless tobacco: None  . Alcohol Use: No   OB History    No data available     Review of Systems  Constitutional: Negative for fever, chills, weight loss and diaphoresis.  HENT: Positive for rhinorrhea and sore throat. Negative for ear pain.   Eyes: Negative for discharge.  Respiratory: Positive for cough, shortness of breath and wheezing.   Cardiovascular: Positive for chest pain.  Musculoskeletal: Negative for myalgias.  Skin:  Negative for rash.  Neurological: Negative for headaches.  All other systems reviewed and are negative.     Allergies  Review of patient's allergies indicates no known allergies.  Home Medications   Prior to Admission medications   Medication Sig Start Date End Date Taking? Authorizing Provider  albuterol (PROVENTIL HFA;VENTOLIN HFA) 108 (90 Base) MCG/ACT inhaler Inhale 1-2 puffs into the lungs every 6 (six) hours as needed for wheezing or shortness of breath. 06/20/15   Danelle Berry, PA-C  albuterol (PROVENTIL) (2.5 MG/3ML) 0.083% nebulizer solution Take 3 mLs (2.5 mg total) by nebulization every 6 (six) hours as needed for wheezing or shortness of breath. 06/20/15   Danelle Berry, PA-C  azithromycin (ZITHROMAX) 250 MG tablet Take 1 tablet (250 mg total) by mouth daily. Take first 2 tablets together, then 1 every day until finished. 06/20/15   Danelle Berry, PA-C  benzonatate (TESSALON) 100 MG capsule Take 1 capsule (100 mg total) by mouth every 8 (eight) hours. 06/15/15   Marily Memos, MD  benzonatate (TESSALON) 100 MG capsule Take 2 capsules (200 mg total) by mouth 2 (two) times daily as needed for cough. 06/20/15   Danelle Berry, PA-C  cetirizine (ZYRTEC ALLERGY) 10 MG tablet Take 1 tablet (10 mg total) by mouth daily. 06/20/15   Danelle Berry, PA-C  cyclobenzaprine (FLEXERIL) 10 MG tablet Take 1 tablet (10 mg total) by mouth at bedtime. 05/11/15   Barbaraann Barthel, MD  guaiFENesin-codeine 100-10 MG/5ML syrup Take 5 mLs by mouth 3 (three) times daily as needed for cough. 06/20/15   Danelle Berry, PA-C  loratadine-pseudoephedrine (CLARITIN-D 12 HOUR) 5-120 MG tablet Take 1 tablet by mouth 2 (two) times daily. 06/15/15   Marily Memos, MD  naproxen (NAPROSYN) 500 MG tablet Take 1 tablet (500 mg total) by mouth 2 (two) times daily with a meal. 05/11/15   Barbaraann Barthel, MD  predniSONE (DELTASONE) 20 MG tablet Take 2 tablets (40 mg total) by mouth daily. Take 40 mg by mouth daily for 3 days, then  by mouth daily for 3  days, then  daily for 3 days 06/20/15   Danelle Berry, PA-C   BP 102/60 mmHg  Pulse 87  Temp(Src) 97.5 F (36.4 C) (Oral)  Resp 16  Ht  (1.651 m)  Wt 62.596 kg  BMI 22.96 kg/m2  SpO2 99%  LMP 06/11/2015 Physical Exam  Constitutional: She is oriented to person, place, and time. She appears well-developed and well-nourished. She is cooperative.  Non-toxic appearance. She appears ill. No distress.  HENT:  Head: Normocephalic and atraumatic.  Nose: Nose normal.  Mouth/Throat: Oropharynx is clear and moist. No oropharyngeal exudate.  Posterior oropharynx erythematous, without exudate, uvula midline Nasal congestion  Eyes: Conjunctivae, EOM and lids are normal. Pupils are equal, round, and reactive to light. Right eye exhibits no chemosis and no discharge. Left eye exhibits no chemosis and no discharge. No scleral icterus.  Neck: Normal range of motion. Neck supple. No JVD present. No tracheal deviation, no edema and  no erythema present. No thyromegaly present.  Cardiovascular: Normal rate, regular rhythm, normal heart sounds and intact distal pulses.  Exam reveals no gallop and no friction rub.   No murmur heard. Pulses:      Radial pulses are 2+ on the right side, and 2+ on the left side.       Dorsalis pedis pulses are 2+ on the right side, and 2+ on the left side.  No lower extremity edema, no lower extremities asymmetry  Pulmonary/Chest: No accessory muscle usage or stridor. No tachypnea. No respiratory distress. She has decreased breath sounds. She has wheezes. She has rhonchi. She has no rales. She exhibits tenderness.  Frequent cough, decreased breath sounds in the mid to lower lung fields with scattered rhonchi and inspiratory and expiratory wheeze, chest wall tender to palpation  Abdominal: Soft. Normal appearance and bowel sounds are normal. She exhibits no distension and no mass. There is no tenderness. There is no rigidity, no rebound, no guarding, no CVA tenderness, no  tenderness at McBurney's point and negative Murphy's sign.  Musculoskeletal: Normal range of motion. She exhibits no edema or tenderness.  Lymphadenopathy:    She has cervical adenopathy.  Neurological: She is alert and oriented to person, place, and time. She has normal reflexes. No cranial nerve deficit. She exhibits normal muscle tone. Coordination normal.  Skin: Skin is warm, dry and intact. No rash noted. She is not diaphoretic. No cyanosis or erythema. No pallor. Nails show no clubbing.  Psychiatric: She has a normal mood and affect. Her behavior is normal. Judgment and thought content normal.  Nursing note and vitals reviewed.   ED Course  Procedures (including critical care time) Labs Review Labs Reviewed  BASIC METABOLIC PANEL - Abnormal; Notable for the following:    Glucose, Bld 101 (*)    All other components within normal limits  CBC  I-STAT TROPOININ, ED    Imaging Review Final result by Rad Results In Interface (06/19/15 23:11:49)   Narrative:   CLINICAL DATA: Cough and mid chest pain for the past 2 weeks  EXAM: CHEST 2 VIEW  COMPARISON: 02/19/2009  FINDINGS: Grossly unchanged cardiac silhouette and mediastinal contours. There is mild diffuse slightly nodular thickening of the pulmonary interstitium, most conspicuous about the bilateral pulmonary hila. A pacer lead overlies the peripheral aspect the right upper lung. No focal airspace opacities. No pleural effusion or pneumothorax. No evidence of edema. No acute osseus abnormalities.  IMPRESSION: Findings suggestive of airways disease / bronchitis. No focal airspace opacities to suggest pneumonia.   Electronically Signed By: Simonne Come M.D. On: 06/19/2015 23:11    I have personally reviewed and evaluated these images and lab results as part of my medical decision-making.   EKG Interpretation   Date/Time:  Monday Jun 19 2015 22:56:44 EDT Ventricular Rate:  109 PR Interval:  142 QRS  Duration: 84 QT Interval:  346 QTC Calculation: 465 R Axis:   74 Text Interpretation:  Sinus tachycardia Otherwise normal ECG Confirmed by  HORTON  MD, Toni Amend ([id number]) on 06/20/2015 2:49:58 AM      MDM   Patient presents with cough and chest pain for the past 2 weeks secondary to URI which was evaluated 4 days ago, patient was diagnosed with URI with conjunctivitis and possible flulike illness, she was discharge with Tessalon Perles, Claritin-D, but she had no improvement.  She return to the ER with worsening cough with occasional hemoptysis, shortness of breath.  On exam she is mildly ill appearing, Lungs  with decreased BS and expiratory wheeze.    Pt given tessalon, prednisone and breathing tx,  pt did not improve much after first treatment, will obtain basic labs, CXR, EKG and trop  CXR negative for PNA, pt reported improvement of breathing after multiple treatments, she was observed on the monitor, was hemodynamically stable, no respiratory distress. Labs reassuring, unremarkable.  EKG sinus tach, trop negative.  Do not suspect PE, pt is PERC negative.  Likely worsening bronchitis or reactive airway after URI/viral illness.  She was discharged with albuterol nebs/inhaler, zpak (for prolonged course of illness), prednisone burst, cough medicine, and zyrtec.    Pt improved condition at discharge, with stable vitals. Filed Vitals:   06/20/15 0400 06/20/15 0511 06/20/15 0545 06/20/15 0645  BP: 108/58 113/75 109/62 102/60  Pulse: 89 102 93 87  Temp:      TempSrc:      Resp: 19 17 16 16   Height:      Weight:      SpO2: 98% 100% 100% 99%     Final diagnoses:  Bronchitis  URI (upper respiratory infection)  Seasonal allergies       Danelle BerryLeisa Buzz Axel, PA-C 06/22/15 1953  Shon Batonourtney F Horton, MD 06/28/15 [phone]

## 2015-09-13 ENCOUNTER — Other Ambulatory Visit: Payer: Self-pay | Admitting: Sports Medicine

## 2015-09-13 DIAGNOSIS — M545 Low back pain: Secondary | ICD-10-CM

## 2015-09-13 DIAGNOSIS — M542 Cervicalgia: Secondary | ICD-10-CM

## 2015-09-27 ENCOUNTER — Ambulatory Visit
Admission: RE | Admit: 2015-09-27 | Discharge: 2015-09-27 | Disposition: A | Discharge status: Home / Self Care | Payer: Worker's Compensation | Source: Ambulatory Visit | Attending: Sports Medicine | Admitting: Sports Medicine

## 2015-09-27 DIAGNOSIS — M545 Low back pain: Secondary | ICD-10-CM

## 2015-09-27 DIAGNOSIS — M542 Cervicalgia: Secondary | ICD-10-CM

## 2016-03-08 ENCOUNTER — Encounter (HOSPITAL_COMMUNITY): Payer: Self-pay | Admitting: Nurse Practitioner

## 2016-03-08 ENCOUNTER — Emergency Department (HOSPITAL_COMMUNITY)
Admission: EM | Admit: 2016-03-08 | Discharge: 2016-03-08 | Disposition: A | Discharge status: Home / Self Care | Payer: Self-pay | Attending: Emergency Medicine | Admitting: Emergency Medicine

## 2016-03-08 DIAGNOSIS — R69 Illness, unspecified: Secondary | ICD-10-CM

## 2016-03-08 DIAGNOSIS — J111 Influenza due to unidentified influenza virus with other respiratory manifestations: Secondary | ICD-10-CM

## 2016-03-08 DIAGNOSIS — R112 Nausea with vomiting, unspecified: Secondary | ICD-10-CM | POA: Insufficient documentation

## 2016-03-08 DIAGNOSIS — R509 Fever, unspecified: Secondary | ICD-10-CM | POA: Insufficient documentation

## 2016-03-08 DIAGNOSIS — Z79899 Other long term (current) drug therapy: Secondary | ICD-10-CM | POA: Insufficient documentation

## 2016-03-08 LAB — CBC
HEMATOCRIT: 41.7 % (ref 36.0–46.0)
HEMOGLOBIN: 14.3 g/dL (ref 12.0–15.0)
MCH: 29.5 pg (ref 26.0–34.0)
MCHC: 34.3 g/dL (ref 30.0–36.0)
MCV: 86 fL (ref 78.0–100.0)
Platelets: 203 10*3/uL (ref 150–400)
RBC: 4.85 MIL/uL (ref 3.87–5.11)
RDW: 13 % (ref 11.5–15.5)
WBC: 9.6 10*3/uL (ref 4.0–10.5)

## 2016-03-08 LAB — I-STAT BETA HCG BLOOD, ED (MC, WL, AP ONLY): I-stat hCG, quantitative: <5 m[IU]/mL (ref <5)

## 2016-03-08 LAB — COMPREHENSIVE METABOLIC PANEL
ALBUMIN: 4.2 g/dL (ref 3.5–5.0)
ALT: 32 U/L (ref 14–54)
ANION GAP: 9 (ref 5–15)
AST: 27 U/L (ref 15–41)
Alkaline Phosphatase: 64 U/L (ref 38–126)
BILIRUBIN TOTAL: 0.7 mg/dL (ref 0.3–1.2)
BUN: 15 mg/dL (ref 6–20)
CO2: 20 mmol/L — ABNORMAL LOW (ref 22–32)
Calcium: 9.2 mg/dL (ref 8.9–10.3)
Chloride: 107 mmol/L (ref 101–111)
Creatinine, Ser: 0.67 mg/dL (ref 0.44–1.00)
GFR calc Af Amer: >60 mL/min (ref >60)
GLUCOSE: 92 mg/dL (ref 65–99)
POTASSIUM: 3.7 mmol/L (ref 3.5–5.1)
Sodium: 136 mmol/L (ref 135–145)
TOTAL PROTEIN: 7.5 g/dL (ref 6.5–8.1)

## 2016-03-08 MED ORDER — ONDANSETRON 4 MG PO TBDP
ORAL_TABLET | ORAL | Status: AC
  Filled 2016-03-08: qty 1

## 2016-03-08 MED ORDER — ONDANSETRON 8 MG PO TBDP: 8.0000 mg | ORAL_TABLET | Freq: Three times a day (TID) | ORAL | 0 refills | Status: DC | PRN

## 2016-03-08 MED ORDER — ONDANSETRON 4 MG PO TBDP
4.0000 mg | ORAL_TABLET | Freq: Once | ORAL | Status: AC | PRN
  Administered 2016-03-08: 4 mg via ORAL

## 2016-03-08 MED ORDER — ONDANSETRON 4 MG PO TBDP
8.0000 mg | ORAL_TABLET | Freq: Once | ORAL | Status: DC
  Filled 2016-03-08: qty 2

## 2016-03-08 NOTE — ED Notes (Signed)
Called 3x, no response.  

## 2016-03-08 NOTE — ED Notes (Signed)
C/o generalized bodyaches with nasal congestion onset 3 days ago , states he vomited today

## 2016-03-08 NOTE — ED Notes (Signed)
Pt encouraged to drink PO fluids.

## 2016-03-08 NOTE — ED Triage Notes (Signed)
Pt presents with c/o GI problem. She reports a 3 day history of fevers, congestion, cough, nausea, body aches. She began vomiting today. She denies any diarrhea. She tried some otc pain medications but vomited afterwards.

## 2016-03-08 NOTE — ED Provider Notes (Signed)
[facility]-EMERGENCY DEPT Provider Note   CSN: [id number] Arrival date & time: 03/08/16  1253     History   Chief Complaint Chief Complaint  Patient presents with  . GI Problem    HPI Ashley Forbes is a 26 y.o. female.  HPI  26 year old female presents today with onset of nasal congestion, cough, subjective fever, chills, nausea, and vomiting 3 today. She has not had a flu shot this year. She has no known sick exposures. She denies any headache, vision changes, dyspnea, or abdominal pain.  Past Medical History:  Diagnosis Date  . Gallstones     There are no active problems to display for this patient.   History reviewed. No pertinent surgical history.  OB History    No data available       Home Medications    Prior to Admission medications   Medication Sig Start Date End Date Taking? Authorizing Provider  albuterol (PROVENTIL HFA;VENTOLIN HFA) 108 (90 Base) MCG/ACT inhaler Inhale 1-2 puffs into the lungs every 6 (six) hours as needed for wheezing or shortness of breath. 06/20/15   Danelle BerryLeisa Tapia, PA-C  albuterol (PROVENTIL) (2.5 MG/3ML) 0.083% nebulizer solution Take 3 mLs (2.5 mg total) by nebulization every 6 (six) hours as needed for wheezing or shortness of breath. 06/20/15   Danelle BerryLeisa Tapia, PA-C  azithromycin (ZITHROMAX) 250 MG tablet Take 1 tablet (250 mg total) by mouth daily. Take first 2 tablets together, then 1 every day until finished. 06/20/15   Danelle BerryLeisa Tapia, PA-C  benzonatate (TESSALON) 100 MG capsule Take 1 capsule (100 mg total) by mouth every 8 (eight) hours. 06/15/15   Marily MemosJason Mesner, MD  benzonatate (TESSALON) 100 MG capsule Take 2 capsules (200 mg total) by mouth 2 (two) times daily as needed for cough. 06/20/15   Danelle BerryLeisa Tapia, PA-C  cetirizine (ZYRTEC ALLERGY) 10 MG tablet Take 1 tablet (10 mg total) by mouth daily. 06/20/15   Danelle BerryLeisa Tapia, PA-C  cyclobenzaprine (FLEXERIL) 10 MG tablet Take 1 tablet (10 mg total) by mouth at bedtime. 05/11/15   Barbaraann BarthelJames O Breen, MD    guaiFENesin-codeine 100-10 MG/5ML syrup Take 5 mLs by mouth 3 (three) times daily as needed for cough. 06/20/15   Danelle BerryLeisa Tapia, PA-C  loratadine-pseudoephedrine (CLARITIN-D 12 HOUR) 5-120 MG tablet Take 1 tablet by mouth 2 (two) times daily. 06/15/15   Marily MemosJason Mesner, MD  naproxen (NAPROSYN) 500 MG tablet Take 1 tablet (500 mg total) by mouth 2 (two) times daily with a meal. 05/11/15   Barbaraann BarthelJames O Breen, MD  predniSONE (DELTASONE) 20 MG tablet Take 2 tablets (40 mg total) by mouth daily. Take 40 mg by mouth daily for 3 days, then 20mg  by mouth daily for 3 days, then 10mg  daily for 3 days 06/20/15   Danelle BerryLeisa Tapia, PA-C    Family History History reviewed. No pertinent family history.  Social History Social History  Substance Use Topics  . Smoking status: Never Smoker  . Smokeless tobacco: Never Used  . Alcohol use No     Allergies   Patient has no known allergies.   Review of Systems Review of Systems  All other systems reviewed and are negative.    Physical Exam Updated Vital Signs BP 121/84   Pulse 113   Temp 98.9 F (37.2 C)   Resp 20   SpO2 100%   Physical Exam  Constitutional: She is oriented to person, place, and time. She appears well-developed and well-nourished. No distress.  HENT:  Head: Normocephalic and atraumatic.  Right  Ear: External ear normal.  Left Ear: External ear normal.  Nose: Nose normal.  Eyes: Conjunctivae and EOM are normal. Pupils are equal, round, and reactive to light.  Neck: Normal range of motion. Neck supple.  Cardiovascular: Normal rate, regular rhythm, normal heart sounds and intact distal pulses.   Pulmonary/Chest: Effort normal and breath sounds normal. She has no wheezes. She has no rales.  Abdominal: Soft. Bowel sounds are normal.  Musculoskeletal: Normal range of motion.  Neurological: She is alert and oriented to person, place, and time. She exhibits normal muscle tone. Coordination normal.  Skin: Skin is warm and dry.  Psychiatric: She has  a normal mood and affect. Her behavior is normal. Thought content normal.  Nursing note and vitals reviewed.    ED Treatments / Results  Labs (all labs ordered are listed, but only abnormal results are displayed) Labs Reviewed  COMPREHENSIVE METABOLIC PANEL - Abnormal; Notable for the following:       Result Value   CO2 20 (*)    All other components within normal limits  CBC  URINALYSIS, ROUTINE W REFLEX MICROSCOPIC  I-STAT BETA HCG BLOOD, ED ([facility], [facility], [facility] ONLY)    EKG  EKG Interpretation None       Radiology No results found.  Procedures Procedures (including critical care time)  Medications Ordered in ED Medications  ondansetron (ZOFRAN-ODT) 4 MG disintegrating tablet (not administered)  ondansetron (ZOFRAN-ODT) disintegrating tablet 8 mg (not administered)  ondansetron (ZOFRAN-ODT) disintegrating tablet 4 mg (4 mg Oral Given 03/08/16 1402)     Initial Impression / Assessment and Plan / ED Course  I have reviewed the triage vital signs and the nursing notes.  Pertinent labs & imaging results that were available during my care of the patient were reviewed by me and considered in my medical decision making (see chart for details).     Patient with laps normal except for CO2 low at 20. She is given Zofran here and oral fluid challenge. 4:08 PM  Patient tolerating fluids without difficulty. We discussed return cautions and need for follow-up and she voices understanding. Final Clinical Impressions(s) / ED Diagnoses   Final diagnoses:  Influenza-like illness    New Prescriptions New Prescriptions   No medications on file     Margarita Grizzle, MD 03/08/16 1609

## 2016-06-19 ENCOUNTER — Ambulatory Visit (INDEPENDENT_AMBULATORY_CARE_PROVIDER_SITE_OTHER): Payer: Self-pay | Admitting: Urgent Care

## 2016-06-19 ENCOUNTER — Encounter: Payer: Self-pay | Admitting: Urgent Care

## 2016-06-19 VITALS — BP 103/70 | HR 111 | Temp 98.7°F | Resp 16 | Ht 61.5 in | Wt 174.4 lb

## 2016-06-19 DIAGNOSIS — J3489 Other specified disorders of nose and nasal sinuses: Secondary | ICD-10-CM

## 2016-06-19 DIAGNOSIS — R0981 Nasal congestion: Secondary | ICD-10-CM

## 2016-06-19 DIAGNOSIS — J019 Acute sinusitis, unspecified: Secondary | ICD-10-CM

## 2016-06-19 DIAGNOSIS — J301 Allergic rhinitis due to pollen: Secondary | ICD-10-CM

## 2016-06-19 MED ORDER — CETIRIZINE HCL 10 MG PO TABS: 10.0000 mg | ORAL_TABLET | Freq: Every day | ORAL | 11 refills | Status: AC

## 2016-06-19 MED ORDER — FLUTICASONE PROPIONATE 50 MCG/ACT NA SUSP: 2.0000 | Freq: Every day | NASAL | 11 refills | Status: AC

## 2016-06-19 MED ORDER — METHYLPREDNISOLONE ACETATE 80 MG/ML IJ SUSP
80.0000 mg | Freq: Once | INTRAMUSCULAR | Status: AC
  Administered 2016-06-19: 80 mg via INTRAMUSCULAR

## 2016-06-19 MED ORDER — AMOXICILLIN 500 MG PO CAPS: 500.0000 mg | ORAL_CAPSULE | Freq: Three times a day (TID) | ORAL | 0 refills | Status: AC

## 2016-06-19 MED ORDER — PSEUDOEPHEDRINE HCL ER 120 MG PO TB12: 120.0000 mg | ORAL_TABLET | Freq: Two times a day (BID) | ORAL | 3 refills | Status: AC

## 2016-06-19 NOTE — Progress Notes (Signed)
  MRN: [id number] DOB: 09-13-1990  Subjective:   Ashley Forbes is a 26 y.o. female presenting for chief complaint of Cough (PRODUCUTIVE with red mucus x 1 week); Fever (with chills); and Medication Refill (albuterol)  Reports 1 week history of worsening productive cough, subjective fever, chills, nasal congestion, sinus pain, ear fullness, bilateral ear pain, sore throat. Cough elicits chest pain, shob, wheezing especially at night. Denies n/v, rashes, dizziness, weakness, dysuria. Has not used any medications for allergies. Had a very similar episode in the past with same symptoms around this time of year. Denies history of asthma. Denies smoking cigarettes or drinking alcohol.   Ashley Forbes is not currently taking any medications.  Also has No Known Allergies. Ashley Forbes  has a past medical history of Gallstones. Also denies past surgical history. Denies family history of cancer, diabetes, HTN, HL, heart disease, stroke, mental illness.  Objective:   Vitals: BP 103/70 (BP Location: Right Arm, Patient Position: Sitting, Cuff Size: Large)   Pulse (!) 111   Temp 98.7 F (37.1 C) (Oral)   Resp 16   Ht 5' 1.5" (1.562 m)   Wt 174 lb 6.4 oz (79.1 kg)   SpO2 99%   BMI 32.42 kg/m   Physical Exam  Constitutional: She is oriented to person, place, and time. She appears well-developed and well-nourished.  HENT:  TM's with mild effusions bilaterally. Nasal turbinates erythematous and edematous, nasal passages minimally patent, with bilateral maxillary sinus tenderness.  Postnasal drip present but without oropharyngeal exudates, erythema or abscesses.  Eyes: Right eye exhibits no discharge. Left eye exhibits no discharge. No scleral icterus.  Neck: Normal range of motion. Neck supple.  Cardiovascular: Normal rate, regular rhythm and intact distal pulses.  Exam reveals no gallop and no friction rub.   No murmur heard. Pulmonary/Chest: No respiratory distress. She has no wheezes. She has no rales.    Abdominal: Soft. Bowel sounds are normal. She exhibits no distension and no mass. There is no tenderness. There is no guarding.  Lymphadenopathy:    She has no cervical adenopathy.  Neurological: She is alert and oriented to person, place, and time.  Skin: Skin is warm and dry. Capillary refill takes less than 2 seconds.  Psychiatric: She has a normal mood and affect.   Assessment and Plan :   1. Seasonal allergic rhinitis due to pollen 2. Acute non-recurrent sinusitis, unspecified location 3. Nasal congestion 4. Sinus pain - Counseled on diagnosis of allergic rhinitis. Start Sudafed, Zyrtec, aggressive hydration. IM Depo-medrol today. Will address secondary sinusitis with amoxicillin for 1 week. Patient is to start Flonase thereafter if she has improvement with her symptoms, otherwise rtc for a recheck.  Wallis Bamberg, PA-C [facility] [phone] 06/19/2016  10:33 AM

## 2016-06-19 NOTE — Patient Instructions (Addendum)
Rinitis alrgica (Allergic Rhinitis) La rinitis alrgica ocurre cuando las membranas mucosas de la nariz responden a los alrgenos. Los alrgenos son las partculas que estn en el aire y que hacen que el cuerpo tenga una reaccin Counselling psychologist. Esto hace que usted libere anticuerpos alrgicos. A travs de una cadena de eventos, estos finalmente hacen que usted libere histamina en la corriente sangunea. Aunque la funcin de la histamina es proteger al organismo, es esta liberacin de histamina lo que provoca malestar, como los estornudos frecuentes, la congestin y goteo y Control and instrumentation engineer. CAUSAS La causa de la rinitis Merchandiser, retail (fiebre del heno) son los alrgenos del polen que pueden provenir del csped, los rboles y Theme park manager. La causa de la rinitis IT consultant (rinitis alrgica perenne) son los alrgenos, como los caros del polvo domstico, la caspa de las mascotas y las esporas del moho. SNTOMAS  Secrecin nasal (congestin).  Goteo y picazn nasales con estornudos y Arboriculturist. DIAGNSTICO Su mdico puede ayudarlo a Warehouse manager alrgeno o los alrgenos que desencadenan sus sntomas. Si usted y su mdico no pueden Chief Strategy Officer cul es el alrgeno, pueden hacerse anlisis de sangre o estudios de la piel. El mdico diagnosticar la afeccin despus de hacerle una historia clnica y un examen fsico. Adems, puede evaluarlo para detectar la presencia de otras enfermedades afines, como asma, conjuntivitis u otitis. TRATAMIENTO La rinitis alrgica no tiene [place], pero puede controlarse con lo siguiente:  Medicamentos que [employer] sntomas de [place], por ejemplo, vacunas contra la [place], aerosoles nasales y antihistamnicos por va oral.  Evitar el alrgeno. La fiebre del heno a menudo puede tratarse con antihistamnicos en las formas de pldoras o aerosol nasal. Los antihistamnicos bloquean los efectos de la histamina. Existen medicamentos de venta libre que pueden ayudar con la  congestin nasal y la hinchazn alrededor de los ojos. Consulte a su mdico antes de tomar o administrarse este medicamento. Si la prevencin del alrgeno o el medicamento recetado no dan resultado, existen muchos medicamentos nuevos que su mdico puede recetarle. Pueden usarse medicamentos ms fuertes si las medidas iniciales no son efectivas. Pueden aplicarse inyecciones desensibilizantes si los medicamentos y la prevencin no funcionan. La desensibilizacin ocurre cuando un paciente recibe vacunas constantes hasta que el cuerpo se vuelve menos sensible al alrgeno. Asegrese de Medical sales representative seguimiento con su mdico si los problemas continan. INSTRUCCIONES PARA EL CUIDADO EN EL HOGAR No es posible evitar por completo los alrgenos, pero puede reducir los sntomas al tomar medidas para limitar su exposicin a ellos. Es muy til saber exactamente a qu es alrgico para que pueda evitar sus desencadenantes especficos. SOLICITE ATENCIN MDICA SI:  Lance Muss.  Desarrolla una tos que no cesa fcilmente (persistente).  Le falta el aire.  Comienza a tener sibilancias.  Los sntomas interfieren con las actividades diarias normales. Esta informacin no tiene Theme park manager el consejo del mdico. Asegrese de hacerle al mdico cualquier pregunta que tenga. Document Released: 11/14/2004 Document Revised: 02/25/2014 Document Reviewed: 10/12/2012 Elsevier Interactive Patient Education  2017 Elsevier Inc.    Sinusitis en adultos (Sinusitis, Adult) La sinusitis es la inflamacin y Chief Technology Officer en los senos paranasales. Los senos paranasales son espacios vacos en los huesos alrededor del rostro. Los senos paranasales se encuentran ubicados:  Alrededor de los ojos.  En la mitad de la frente.  Detrs de Architectural technologist.  En los pmulos. Los senos y las fosas nasales estn cubiertos de un lquido fibroso (mucosidad). Normalmente, la mucosidad drena a travs de los  senos. [employer] tejidos nasales se  inflaman o hinchan, la mucosidad puede quedar atrapada o bloqueada, de modo que no puede fluir por los senos paranasales. Esto fomenta la proliferacin de bacterias, virus y hongos, lo que produce infecciones. La sinusitis puede desarrollarse rpidamente y durar entre 7 y 10das (aguda) o ms de 12das (crnica). A menudo, esta afeccin surge despus de un resfriado. CAUSAS Esta afeccin es causada por cualquier sustancia que inflame los senos o evite que la mucosidad drene, por ejemplo:  Alergias.  Asma.  Infecciones virales o bacterianas.  Huesos con forma [employer] las fosas nasales.  Crecimientos nasales que contienen mucosidad (plipos nasales).  Aberturas sinusales estrechas.  Agentes contaminantes, como sustancias qumicas o irritantes presentes en el aire.  Un cuerpo extrao atorado [employer].  Infecciones por hongos. Esto es raro. FACTORES DE RIESGO Los siguientes factores pueden hacer que usted sea propenso a sufrir esta afeccin:  Archivist o asma.  Haber tenido una infeccin reciente en las vas respiratorias superiores o un resfriado.  Tener deformidades estructurales o bloqueos en la nariz o los senos.  Tener un sistema inmunitario dbil.  Nadar o bucear mucho.  Abusar de los [employer].  Fumar. SNTOMAS Los principales sntomas de esta afeccin son dolor y sensacin de presin alrededor de los senos afectados. Otros sntomas pueden ser los siguientes:  Dolor en los dientes superiores.  Dolor de odos.  Dolor de [place].  Mal aliento.  Disminucin del sentido del olfato y del gusto.  Tos que empeora por la noche.  Fatiga.  Grant Ruts.  Mucosidad espesa que sale de la Clinical cytogeneticist. Generalmente, es de color verde y puede contener pus (purulento).  Nariz tapada o congestin nasal.  Goteo posnasal. Esto ocurre cuando se acumula mucosidad adicional en la garganta o la parte de atrs de la Clinical cytogeneticist.  Hinchazn y calor en los senos  paranasales afectados.  Dolor de Advertising copywriter.  Sensibilidad a Statistician. DIAGNSTICO Esta enfermedad se diagnostica en funcin de los sntomas, los antecedentes mdicos y un examen fsico. Para descubrir si su afeccin es aguda o crnica, el mdico podra hacer lo siguiente:  Revisar su nariz en busca de plipos nasales.  Palpar los senos paranasales afectados para buscar signos de infeccin.  Observar la parte interna de los senos paranasales con un dispositivo que tiene una luz (endoscopio). Si el mdico sospecha que usted padece sinusitis crnica, podra indicarle lo siguiente:  Pruebas de alergias.  Una muestra de mucosidad de la nariz (cultivo nasal) para buscar bacterias.  Examen de [place] de mucosidad, para ver si la sinusitis se relaciona con alguna alergia. Si la sinusitis no responde al tratamiento y dura ms de 8semanas, se le podra pedir una resonancia magntica o una tomografa computarizada para examinar los senos paranasales. Estos estudios tambin ayudan a Production assistant, radio gravedad de la infeccin. En contadas ocasiones, se puede ordenar una biopsia de hueso para descartar tipos ms graves de infecciones por hongos en los senos paranasales. TRATAMIENTO El tratamiento para la sinusitis depende de la causa y de si la afeccin es [place]. Si lo que causa la sinusitis es un virus, los sntomas desparecern por s solos en el trmino de 10das. Podran recetarle medicamentos para [employer], entre los que se incluyen los siguientes:  Descongestivos nasales tpicos. Desinflaman las fosas nasales y permiten que la mucosidad drene por los senos paranasales.  Antihistamnicos. Este tipo de medicamento bloquea la inflamacin que ocasionan las [place]. Pueden ayudar a reducir  la inflamacin en la nariz y los senos.  Corticoides nasales tpicos. Son aerosoles nasales que reducen la inflamacin e hinchazn en la Darene Lamer y los senos.  Lavados nasales con solucin  salina. Estos enjuagues pueden ayudar a eliminar la mucosidad espesa en la nariz. Si la afeccin es causada por una bacteria, se le recetarn antibiticos. Si es causada por un hongo, se le recetarn antimicticos. Se podra necesitar ciruga para tratar enfermedades preexistentes, como las fosas nasales estrechas. Tambin podra ser necesaria para eliminar plipos. INSTRUCCIONES PARA EL CUIDADO EN EL HOGAR [employer], use o aplquese los medicamentos de venta libre y Building control surveyor como se lo haya indicado el mdico. Estos pueden incluir aerosoles nasales.  Si le recetaron un antibitico, tmelo como se lo haya indicado el mdico. No deje de tomar los antibiticos aunque comience a Actor. Hidrtese y [employer].  Beba suficiente agua para mantener la orina clara o de color amarillo plido. Mantenerse hidratado lo ayudar a [employer].  Use un humidificador de vapor fro para mantener la humedad de su hogar por encima del 50%.  Realice inhalaciones de vapor por 10 a , de 3 a 4veces al da o tal como se lo haya indicado el mdico. Puede hacer esto en el bao con el vapor del agua caliente de la ducha.  Limite la exposicin al aire fro o seco. Reposo  Descanse todo lo que pueda.  Duerma con la cabeza elevada.  Asegrese de dormir lo suficiente cada noche. Instrucciones generales  Aplquese un pao tibio y hmedo en la cara 3 o 4veces al da o como se lo haya indicado el mdico. Esto ayuda a Optician, dispensing las [place].  Lvese las manos frecuentemente con agua y jabn para reducir la exposicin a virus y otras bacterias. Use desinfectante para manos si no dispone de [place] y [place].  No fume. Evite estar cerca de personas que fuman (fumador pasivo).  Concurra a todas las visitas de control como se lo haya indicado el mdico. Esto es importante. SOLICITE ATENCIN MDICA SI:  Lance Muss.  Los sntomas empeoran.  Los sntomas no  mejoran en el trmino de 10das. SOLICITE ATENCIN MDICA DE INMEDIATO SI:  Tiene un dolor de cabeza intenso.  Tiene vmitos persistentes.  Tiene dolor o hinchazn en la zona del rostro o los ojos.  Tiene problemas de visin.  Se siente confundido.  Tiene el cuello rgido.  Tiene dificultad para respirar. Esta informacin no tiene Theme park manager el consejo del mdico. Asegrese de hacerle al mdico cualquier pregunta que tenga. Document Released: 11/14/2004 Document Revised: 05/29/2015 Document Reviewed: 11/30/2014 Elsevier Interactive Patient Education  2017 [employer].    IF you received an x-ray today, you will receive an invoice from [facility]. Please contact [facility] at [phone] with questions or concerns regarding your invoice.   IF you received labwork today, you will receive an invoice from [place]. Please contact [facility] at [phone] with questions or concerns regarding your invoice.   Our billing staff will not be able to assist you with questions regarding bills from these companies.  You will be contacted with the lab results as soon as they are available. The fastest way to get your results is to activate your My Chart account. Instructions are located on the last page of this paperwork. If you have not heard from [place] regarding the results in 2 weeks, please contact this office.

## 2016-07-09 ENCOUNTER — Emergency Department (HOSPITAL_COMMUNITY): Payer: Self-pay

## 2016-07-09 ENCOUNTER — Encounter (HOSPITAL_COMMUNITY): Payer: Self-pay

## 2016-07-09 ENCOUNTER — Emergency Department (HOSPITAL_COMMUNITY)
Admission: EM | Admit: 2016-07-09 | Discharge: 2016-07-10 | Disposition: A | Discharge status: Home / Self Care | Payer: Self-pay | Attending: Emergency Medicine | Admitting: Emergency Medicine

## 2016-07-09 DIAGNOSIS — J45901 Unspecified asthma with (acute) exacerbation: Secondary | ICD-10-CM | POA: Insufficient documentation

## 2016-07-09 LAB — BASIC METABOLIC PANEL
ANION GAP: 10 (ref 5–15)
BUN: 18 mg/dL (ref 6–20)
CALCIUM: 9.1 mg/dL (ref 8.9–10.3)
CHLORIDE: 107 mmol/L (ref 101–111)
CO2: 22 mmol/L (ref 22–32)
CREATININE: 0.75 mg/dL (ref 0.44–1.00)
GFR calc non Af Amer: >60 mL/min (ref >60)
Glucose, Bld: 100 mg/dL — ABNORMAL HIGH (ref 65–99)
Potassium: 3.7 mmol/L (ref 3.5–5.1)
SODIUM: 139 mmol/L (ref 135–145)

## 2016-07-09 LAB — I-STAT TROPONIN, ED: TROPONIN I, POC: 0 ng/mL (ref 0.00–0.08)

## 2016-07-09 LAB — CBC
HCT: 41.8 % (ref 36.0–46.0)
HEMOGLOBIN: 14.4 g/dL (ref 12.0–15.0)
MCH: 29.4 pg (ref 26.0–34.0)
MCHC: 34.4 g/dL (ref 30.0–36.0)
MCV: 85.3 fL (ref 78.0–100.0)
PLATELETS: 210 10*3/uL (ref 150–400)
RBC: 4.9 MIL/uL (ref 3.87–5.11)
RDW: 13 % (ref 11.5–15.5)
WBC: 9.1 10*3/uL (ref 4.0–10.5)

## 2016-07-09 NOTE — ED Notes (Signed)
Pt inquiring about wait time, nurse explained delay/process and high census. 

## 2016-07-09 NOTE — ED Triage Notes (Signed)
Pt reports chest pain that radiates to her back associated with SOB; onset last night.

## 2016-07-09 NOTE — ED Notes (Signed)
Asking how long before she goes to a room

## 2016-07-10 LAB — D-DIMER, QUANTITATIVE: D-Dimer, Quant: <0.27 ug/mL-FEU (ref 0.00–0.50)

## 2016-07-10 MED ORDER — ALBUTEROL SULFATE HFA 108 (90 BASE) MCG/ACT IN AERS
2.0000 | INHALATION_SPRAY | Freq: Once | RESPIRATORY_TRACT | Status: AC
  Administered 2016-07-10: 2 via RESPIRATORY_TRACT
  Filled 2016-07-10: qty 6.7

## 2016-07-10 MED ORDER — IPRATROPIUM BROMIDE 0.02 % IN SOLN
0.5000 mg | Freq: Once | RESPIRATORY_TRACT | Status: AC
  Administered 2016-07-10: 0.5 mg via RESPIRATORY_TRACT
  Filled 2016-07-10: qty 2.5

## 2016-07-10 MED ORDER — PREDNISONE 20 MG PO TABS
60.0000 mg | ORAL_TABLET | Freq: Once | ORAL | Status: AC
  Administered 2016-07-10: 60 mg via ORAL
  Filled 2016-07-10: qty 3

## 2016-07-10 MED ORDER — ALBUTEROL SULFATE (2.5 MG/3ML) 0.083% IN NEBU
5.0000 mg | INHALATION_SOLUTION | Freq: Once | RESPIRATORY_TRACT | Status: AC
  Administered 2016-07-10: 5 mg via RESPIRATORY_TRACT
  Filled 2016-07-10: qty 6

## 2016-07-10 MED ORDER — PREDNISONE 20 MG PO TABS: 40.0000 mg | ORAL_TABLET | Freq: Every day | ORAL | 0 refills | Status: AC

## 2016-07-10 MED ORDER — BENZONATATE 100 MG PO CAPS: 100.0000 mg | ORAL_CAPSULE | Freq: Three times a day (TID) | ORAL | 0 refills | Status: AC | PRN

## 2016-07-10 NOTE — ED Provider Notes (Signed)
[facility]-EMERGENCY DEPT Provider Note   CSN: [id number] Arrival date & time: 07/09/16  1507     History   Chief Complaint Chief Complaint  Patient presents with  . Shortness of Breath    HPI Ashley LlanoSandra Forbes is a 26 y.o. female.  26 year old female presents to the emergency department for shortness of breath. She describes a chest tightness which has been worsening over the past week. Discomfort radiates from central chest to her mid back. She has been using over-the-counter allergy tablets for symptoms without relief. SOB aggravated with exertion. She states that she has had similar symptoms in the past while in [place] and was told that she may have asthma. No fevers or sick contacts. No syncope, leg swelling, surgeries, hospitalizations, or travel. Patient does report use of birth control.      Past Medical History:  Diagnosis Date  . Gallstones     There are no active problems to display for this patient.   History reviewed. No pertinent surgical history.  OB History    No data available       Home Medications    Prior to Admission medications   Medication Sig Start Date End Date Taking? Authorizing Provider  cetirizine (ZYRTEC) 10 MG tablet Take 1 tablet (10 mg total) by mouth daily. 06/19/16  Yes Wallis BambergMani, Mario, PA-C  fluticasone (FLONASE) 50 MCG/ACT nasal spray Place 2 sprays into both nostrils daily. 06/19/16  Yes Wallis BambergMani, Mario, PA-C  pseudoephedrine (SUDAFED 12 HOUR) 120 MG 12 hr tablet Take 1 tablet (120 mg total) by mouth 2 (two) times daily. 06/19/16  Yes Wallis BambergMani, Mario, PA-C  amoxicillin (AMOXIL) 500 MG capsule Take 1 capsule (500 mg total) by mouth 3 (three) times daily. Patient not taking: Reported on 07/10/2016 06/19/16   Wallis BambergMani, Mario, PA-C  benzonatate (TESSALON) 100 MG capsule Take 1 capsule (100 mg total) by mouth 3 (three) times daily as needed for cough. 07/10/16   Antony MaduraHumes, Geanette Buonocore, PA-C  predniSONE (DELTASONE) 20 MG tablet Take 2 tablets (40 mg total) by mouth daily. 07/10/16    Antony MaduraHumes, Ardena Gangl, PA-C    Family History Family History  Problem Relation Age of Onset  . Cancer Maternal Grandmother        skin cancer    Social History Social History  Substance Use Topics  . Smoking status: Never Smoker  . Smokeless tobacco: Never Used  . Alcohol use No     Allergies   Patient has no known allergies.   Review of Systems Review of Systems Ten systems reviewed and are negative for acute change, except as noted in the HPI.    Physical Exam Updated Vital Signs BP 117/68   Pulse 86   Temp 98.9 F (37.2 C) (Oral)   Resp (!) 21   LMP 07/09/2016   SpO2 99%   Physical Exam  Constitutional: She is oriented to person, place, and time. She appears well-developed and well-nourished. No distress.  Nontoxic appearing and in NAD  HENT:  Head: Normocephalic and atraumatic.  Mild nasal congestion  Eyes: Conjunctivae and EOM are normal. No scleral icterus.  Neck: Normal range of motion.  No JVD  Cardiovascular: Normal rate, regular rhythm and intact distal pulses.   Pulmonary/Chest: Effort normal. No respiratory distress. She has wheezes. She has no rales.  Faint expiratory wheeze posterior bilateral upper lobes.  Musculoskeletal: Normal range of motion.  Neurological: She is alert and oriented to person, place, and time. She exhibits normal muscle tone. Coordination normal.  GCS 15.  Patient moving all extremities.  Skin: Skin is warm and dry. No rash noted. She is not diaphoretic. No erythema. No pallor.  Psychiatric: She has a normal mood and affect. Her behavior is normal.  Nursing note and vitals reviewed.    ED Treatments / Results  Labs (all labs ordered are listed, but only abnormal results are displayed) Labs Reviewed  BASIC METABOLIC PANEL - Abnormal; Notable for the following:       Result Value   Glucose, Bld 100 (*)    All other components within normal limits  CBC  D-DIMER, QUANTITATIVE (NOT AT [facility])  Rosezena Sensor, ED    EKG   EKG Interpretation None       Radiology Dg Chest 2 View  Result Date: 07/09/2016 CLINICAL DATA:  Chest pain radiating to the back EXAM: CHEST  2 VIEW COMPARISON:  06/19/2015 FINDINGS: The heart size and mediastinal contours are within normal limits. Both lungs are clear. The visualized skeletal structures are unremarkable. IMPRESSION: No active cardiopulmonary disease. Electronically Signed   By: Elige Ko   On: 07/09/2016 15:58    Procedures Procedures (including critical care time)  Medications Ordered in ED Medications  predniSONE (DELTASONE) tablet 60 mg (60 mg Oral Given 07/10/16 0054)  albuterol (PROVENTIL) (2.5 MG/3ML) 0.083% nebulizer solution 5 mg (5 mg Nebulization Given 07/10/16 0053)  ipratropium (ATROVENT) nebulizer solution 0.5 mg (0.5 mg Nebulization Given 07/10/16 0053)  albuterol (PROVENTIL HFA;VENTOLIN HFA) 108 (90 Base) MCG/ACT inhaler 2 puff (2 puffs Inhalation Given 07/10/16 0236)     Initial Impression / Assessment and Plan / ED Course  I have reviewed the triage vital signs and the nursing notes.  Pertinent labs & imaging results that were available during my care of the patient were reviewed by me and considered in my medical decision making (see chart for details).     26 year old female presents to the emergency department for chest tightness and shortness of breath. She reports a history of seasonal allergies as well as similar symptoms in the past when diagnosed with asthma. Symptoms have resolved with DuoNeb and prednisone. Lungs clear on repeat auscultation.   Suspect allergic bronchospasm. Laboratory workup is reassuring. Chest x-ray without focal consolidation, pneumonia, pneumothorax. Will continue with supportive management. Return precautions discussed and provided. Patient discharged in stable condition with no unaddressed concerns.   Vitals:   07/10/16 0053 07/10/16 0100 07/10/16 0145 07/10/16 0230  BP: 97/72 101/67 121/68 117/68  Pulse: 74  77 87 86  Resp: 18 15 (!) 24 (!) 21  Temp:      TempSrc:      SpO2: 100% 100% 99% 99%    Final Clinical Impressions(s) / ED Diagnoses   Final diagnoses:  Allergic bronchitis with acute exacerbation    New Prescriptions Discharge Medication List as of 07/10/2016  2:31 AM    START taking these medications   Details  benzonatate (TESSALON) 100 MG capsule Take 1 capsule (100 mg total) by mouth 3 (three) times daily as needed for cough., Starting Wed 07/10/2016, Print    predniSONE (DELTASONE) 20 MG tablet Take 2 tablets (40 mg total) by mouth daily., Starting Wed 07/10/2016, Print         Antony Madura, PA-C 07/10/16 0247    Gilda Crease, MD 07/10/16 [phone]

## 2017-01-15 IMAGING — CR DG THORACIC SPINE 2V
3 series · 3 of 3 positions shown · non-contrast
Comparison: Chest radiographs 02/19/2009.

CLINICAL DATA: Persistent back pain after falling 1 week ago.

EXAM:
THORACIC SPINE 2 VIEWS

[view not recorded (1 of 3)]
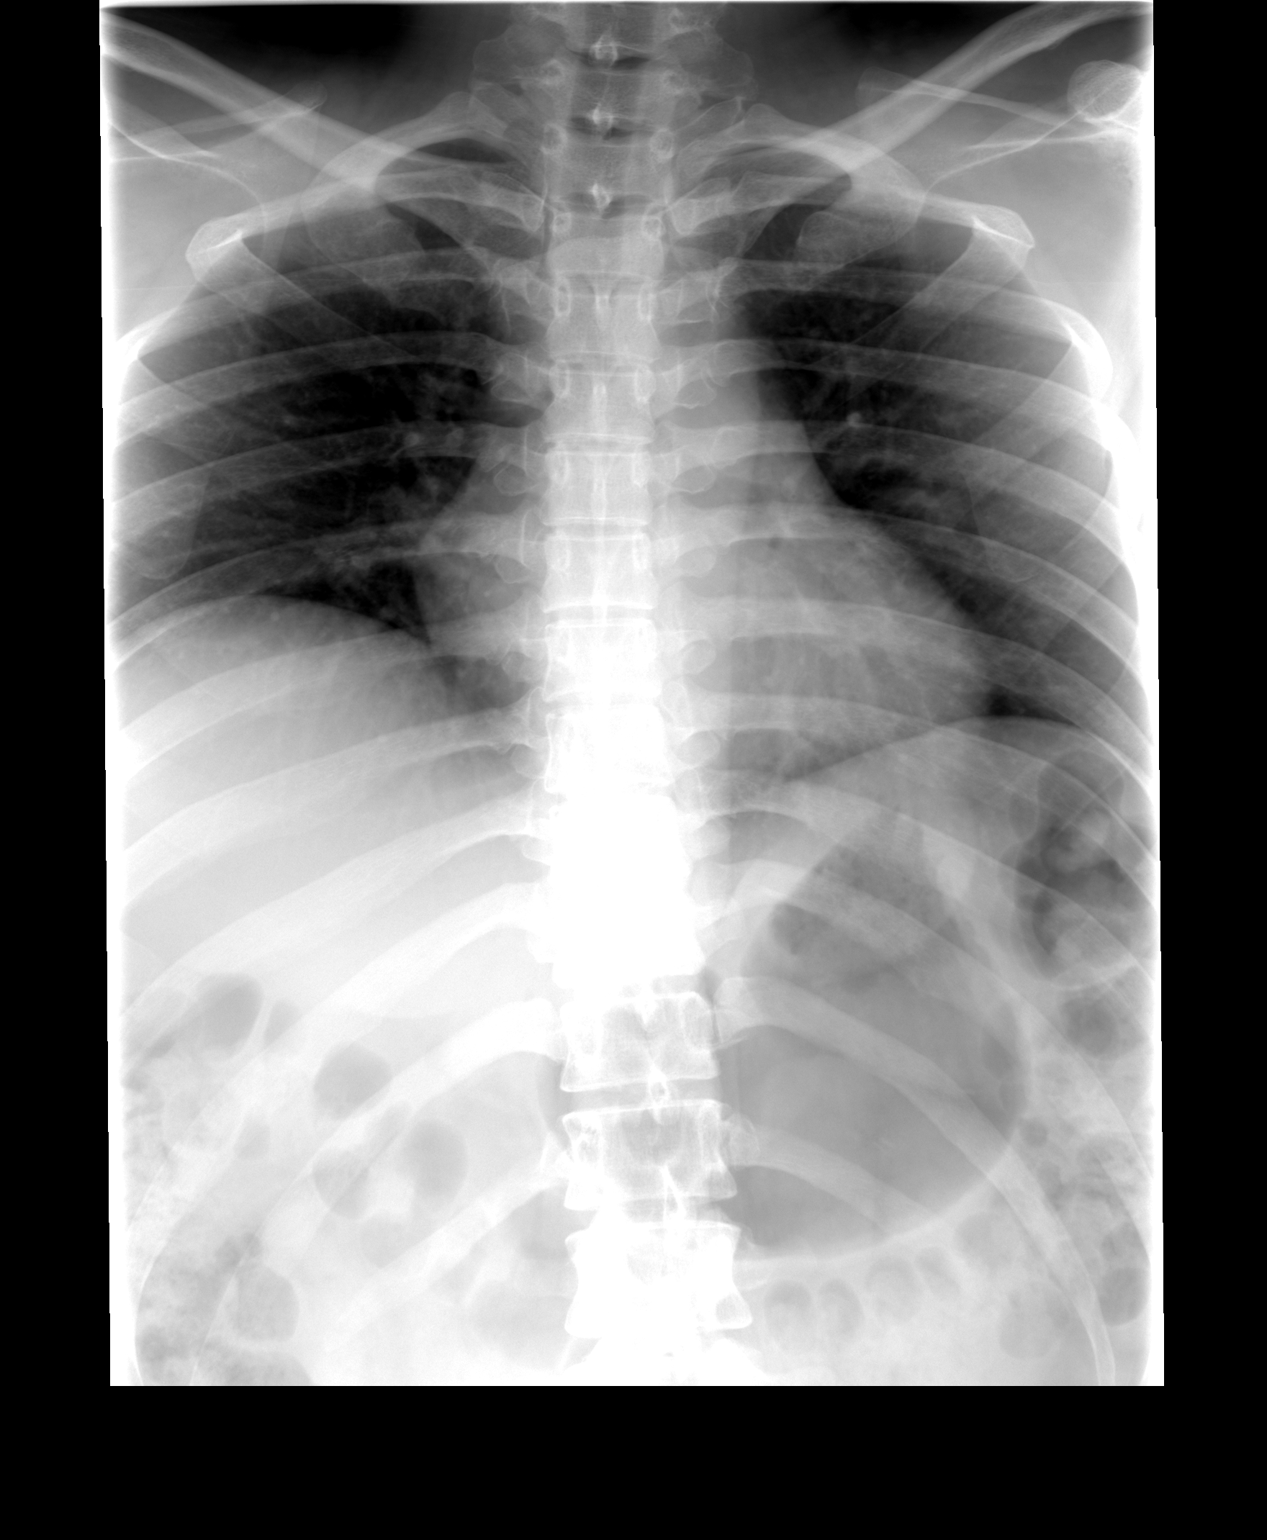

[view not recorded (2 of 3)]
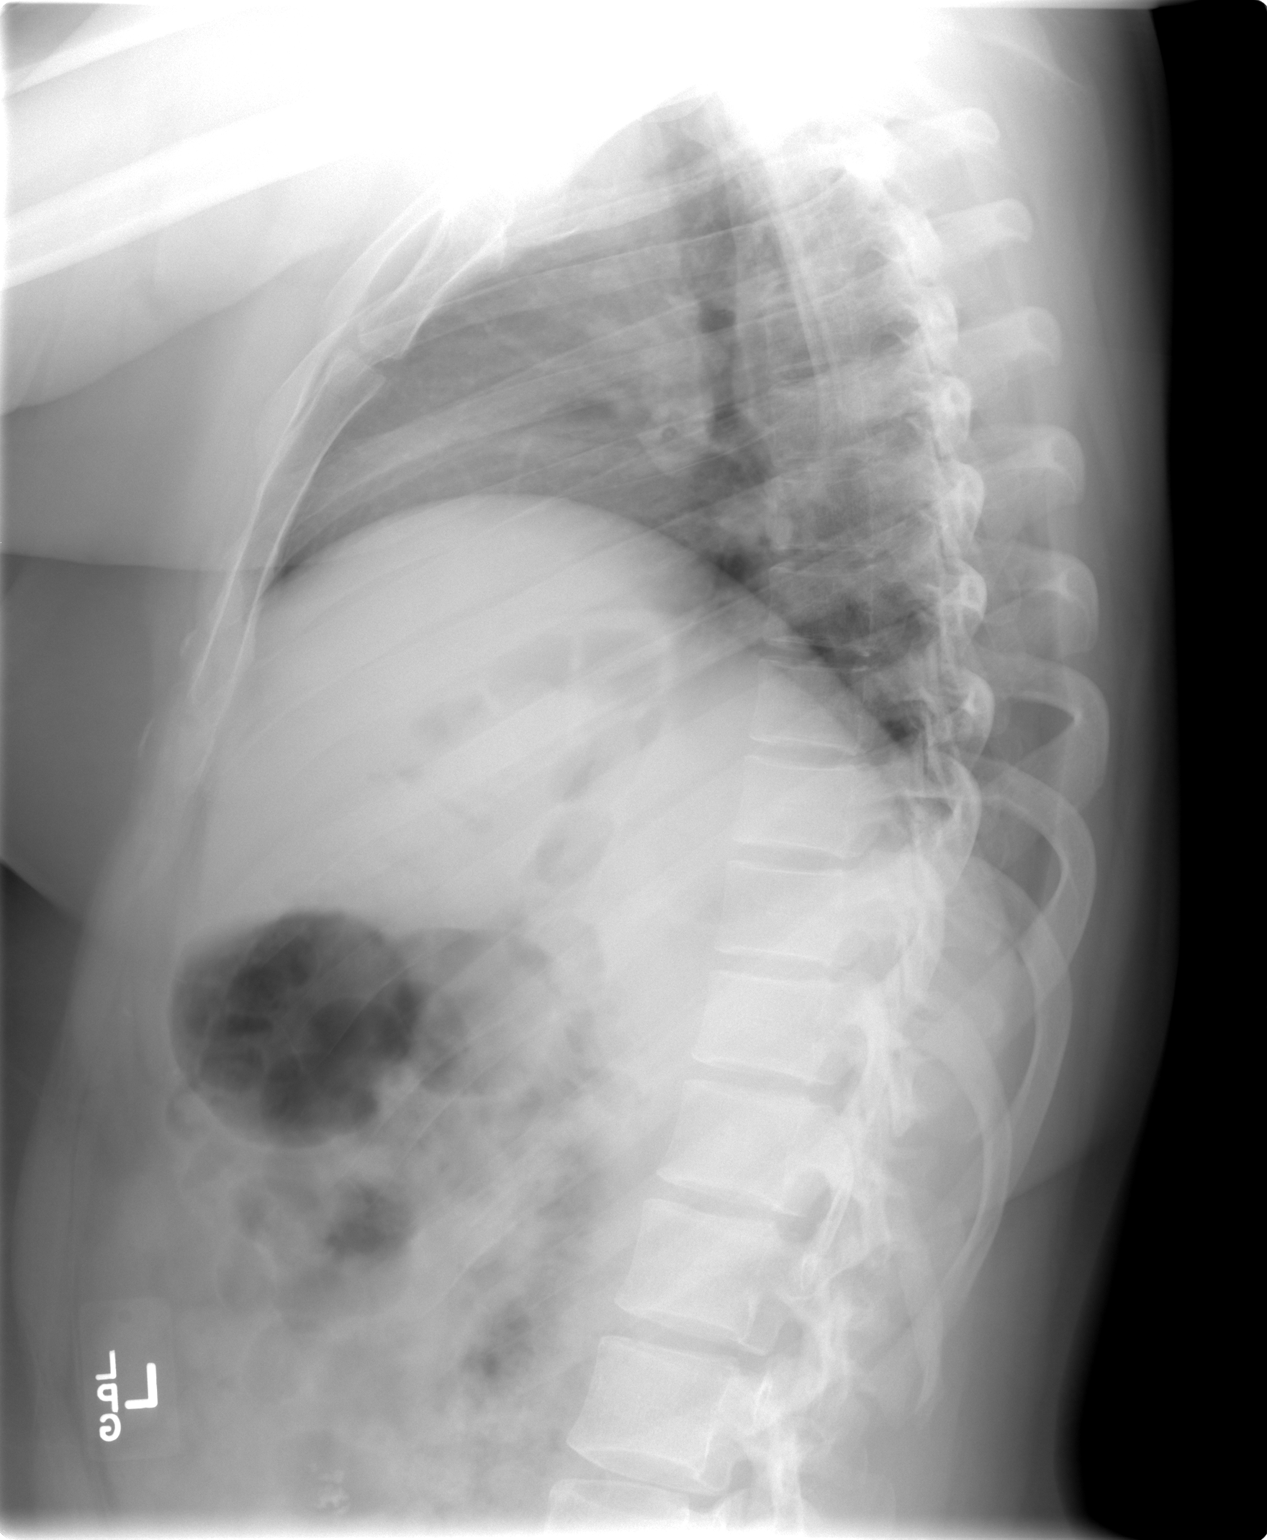

[view not recorded (3 of 3)]
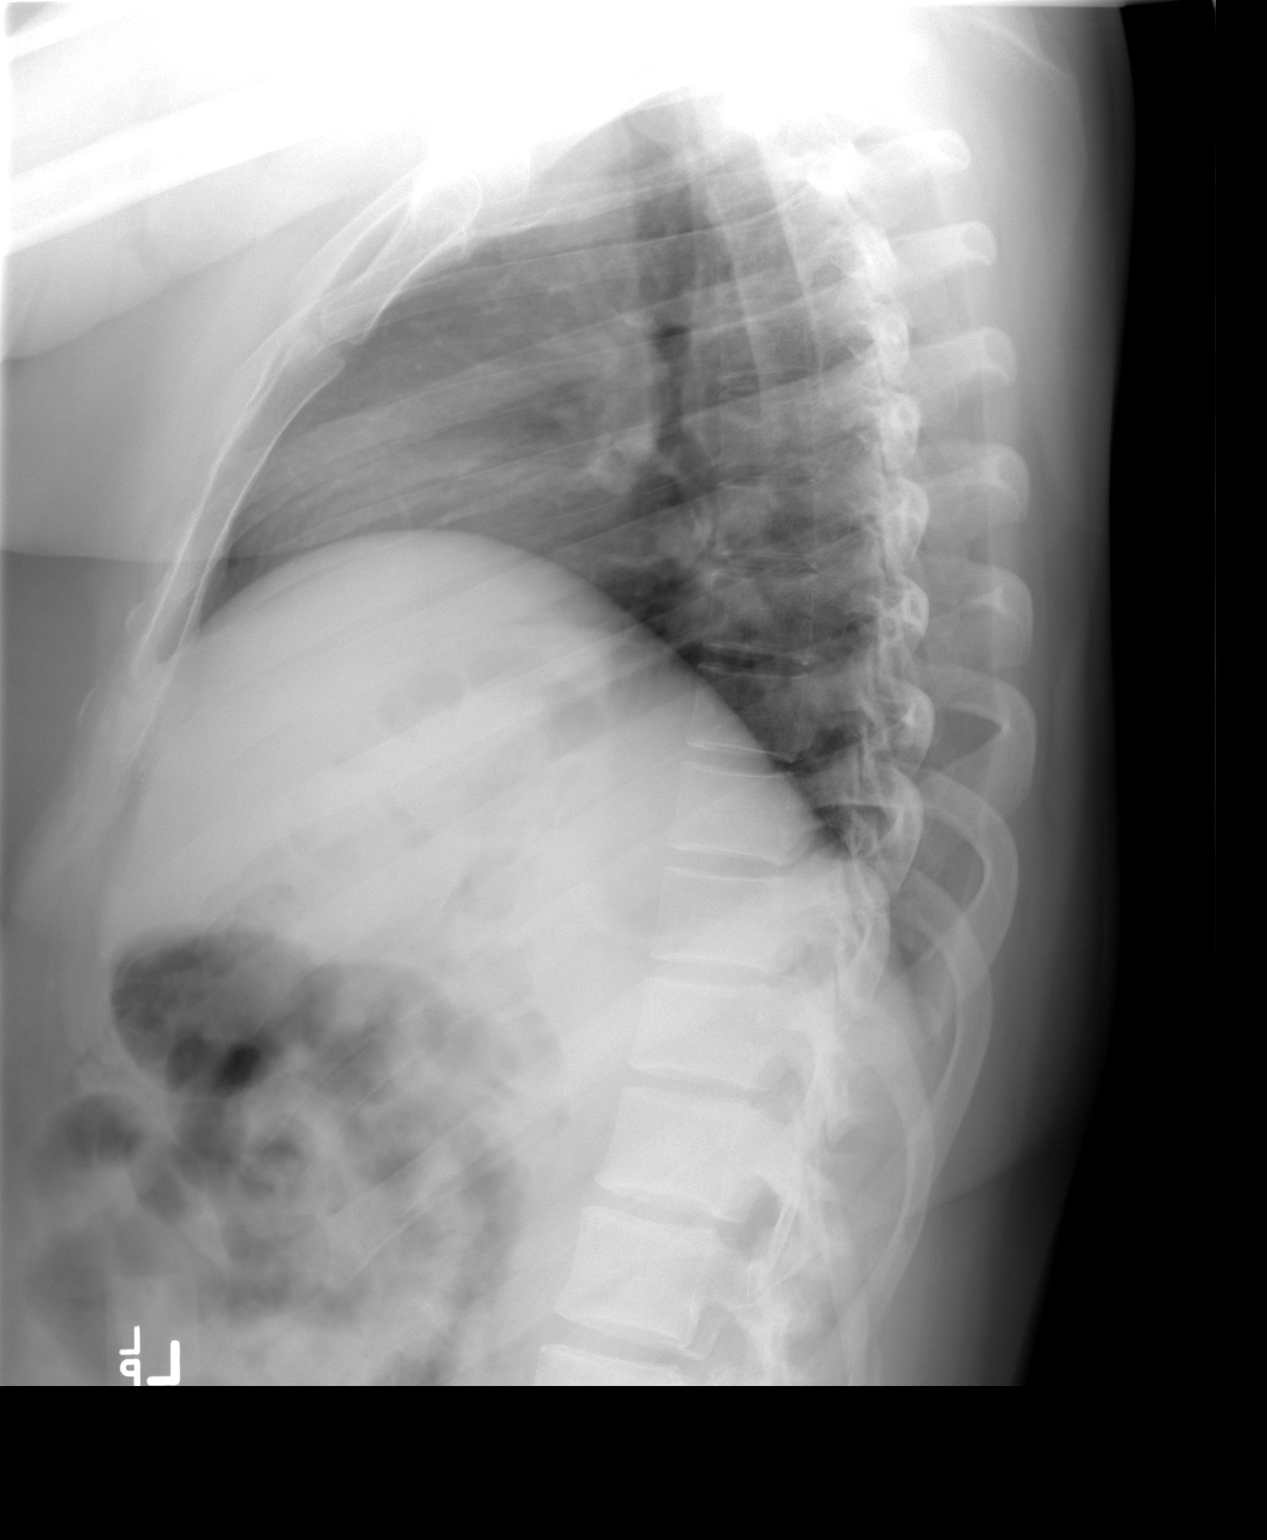

[3 of 3 positions shown; findings below may reference images not displayed]

FINDINGS: Twelve rib-bearing thoracic type vertebral bodies. The alignment is
normal aside from a minimal convex right scoliosis. No evidence of
acute fracture, paraspinal hematoma or widening of the
interpedicular distance. The disc spaces appear preserved.
IMPRESSION: No acute osseous findings.  Minimal scoliosis.
